# Patient Record
Sex: Female | Born: 1935 | Race: White | Hispanic: No | Smoking: Former smoker
Health system: Southern US, Community
[De-identification: ages and names within clinical notes are randomized; demographics above are authoritative.]

## PROBLEM LIST (undated history)

## (undated) DIAGNOSIS — I10 Essential (primary) hypertension: Secondary | ICD-10-CM

## (undated) DIAGNOSIS — C50919 Malignant neoplasm of unspecified site of unspecified female breast: Secondary | ICD-10-CM

## (undated) DIAGNOSIS — Z923 Personal history of irradiation: Secondary | ICD-10-CM

## (undated) DIAGNOSIS — E079 Disorder of thyroid, unspecified: Secondary | ICD-10-CM

## (undated) DIAGNOSIS — M8580 Other specified disorders of bone density and structure, unspecified site: Secondary | ICD-10-CM

## (undated) DIAGNOSIS — E785 Hyperlipidemia, unspecified: Secondary | ICD-10-CM

## (undated) HISTORY — DX: Other specified disorders of bone density and structure, unspecified site: M85.80

## (undated) HISTORY — PX: VAGINAL HYSTERECTOMY: SUR661

## (undated) HISTORY — DX: Disorder of thyroid, unspecified: E07.9

## (undated) HISTORY — DX: Essential (primary) hypertension: I10

## (undated) HISTORY — DX: Hyperlipidemia, unspecified: E78.5

---

## 1987-02-06 DIAGNOSIS — Z923 Personal history of irradiation: Secondary | ICD-10-CM

## 1987-02-06 DIAGNOSIS — C50919 Malignant neoplasm of unspecified site of unspecified female breast: Secondary | ICD-10-CM

## 1987-02-06 HISTORY — DX: Malignant neoplasm of unspecified site of unspecified female breast: C50.919

## 1987-02-06 HISTORY — PX: BREAST LUMPECTOMY: SHX2

## 1987-02-06 HISTORY — DX: Personal history of irradiation: Z92.3

## 1997-07-01 ENCOUNTER — Ambulatory Visit (HOSPITAL_COMMUNITY): Admission: RE | Admit: 1997-07-01 | Discharge: 1997-07-02 | Payer: Self-pay | Admitting: General Surgery

## 1998-04-06 ENCOUNTER — Ambulatory Visit (HOSPITAL_COMMUNITY): Admission: RE | Admit: 1998-04-06 | Discharge: 1998-04-06 | Payer: Self-pay | Admitting: Specialist

## 1998-05-09 ENCOUNTER — Ambulatory Visit (HOSPITAL_COMMUNITY): Admission: RE | Admit: 1998-05-09 | Discharge: 1998-05-09 | Payer: Self-pay | Admitting: Specialist

## 1998-06-07 ENCOUNTER — Ambulatory Visit: Admission: RE | Admit: 1998-06-07 | Discharge: 1998-06-07 | Payer: Self-pay | Admitting: Family Medicine

## 1999-07-04 ENCOUNTER — Encounter: Admission: RE | Admit: 1999-07-04 | Discharge: 1999-07-04 | Payer: Self-pay | Admitting: Family Medicine

## 1999-07-04 ENCOUNTER — Encounter: Payer: Self-pay | Admitting: Family Medicine

## 2000-07-09 ENCOUNTER — Encounter: Payer: Self-pay | Admitting: Family Medicine

## 2000-07-09 ENCOUNTER — Encounter: Admission: RE | Admit: 2000-07-09 | Discharge: 2000-07-09 | Payer: Self-pay | Admitting: Family Medicine

## 2000-10-23 ENCOUNTER — Ambulatory Visit (HOSPITAL_COMMUNITY): Admission: RE | Admit: 2000-10-23 | Discharge: 2000-10-23 | Payer: Self-pay | Admitting: Gastroenterology

## 2001-07-08 ENCOUNTER — Other Ambulatory Visit: Admission: RE | Admit: 2001-07-08 | Discharge: 2001-07-08 | Payer: Self-pay | Admitting: Family Medicine

## 2001-07-15 ENCOUNTER — Encounter: Admission: RE | Admit: 2001-07-15 | Discharge: 2001-07-15 | Payer: Self-pay | Admitting: Family Medicine

## 2001-07-15 ENCOUNTER — Encounter: Payer: Self-pay | Admitting: Family Medicine

## 2002-08-04 ENCOUNTER — Encounter: Admission: RE | Admit: 2002-08-04 | Discharge: 2002-08-04 | Payer: Self-pay | Admitting: Family Medicine

## 2002-08-04 ENCOUNTER — Encounter: Payer: Self-pay | Admitting: Family Medicine

## 2003-08-16 ENCOUNTER — Encounter: Admission: RE | Admit: 2003-08-16 | Discharge: 2003-08-16 | Payer: Self-pay | Admitting: Family Medicine

## 2004-08-03 ENCOUNTER — Other Ambulatory Visit: Admission: RE | Admit: 2004-08-03 | Discharge: 2004-08-03 | Payer: Self-pay | Admitting: Family Medicine

## 2004-08-21 ENCOUNTER — Encounter: Admission: RE | Admit: 2004-08-21 | Discharge: 2004-08-21 | Payer: Self-pay | Admitting: Family Medicine

## 2005-10-04 ENCOUNTER — Encounter: Admission: RE | Admit: 2005-10-04 | Discharge: 2005-10-04 | Payer: Self-pay | Admitting: Family Medicine

## 2006-08-12 ENCOUNTER — Inpatient Hospital Stay (HOSPITAL_COMMUNITY): Admission: AD | Admit: 2006-08-12 | Discharge: 2006-08-15 | Payer: Self-pay | Admitting: Orthopedic Surgery

## 2006-10-09 ENCOUNTER — Encounter: Admission: RE | Admit: 2006-10-09 | Discharge: 2006-10-09 | Payer: Self-pay | Admitting: Family Medicine

## 2007-10-20 ENCOUNTER — Encounter: Admission: RE | Admit: 2007-10-20 | Discharge: 2007-10-20 | Payer: Self-pay | Admitting: Family Medicine

## 2008-10-20 ENCOUNTER — Encounter: Admission: RE | Admit: 2008-10-20 | Discharge: 2008-10-20 | Payer: Self-pay | Admitting: Family Medicine

## 2009-07-11 ENCOUNTER — Emergency Department (HOSPITAL_COMMUNITY): Admission: EM | Admit: 2009-07-11 | Discharge: 2009-07-11 | Payer: Self-pay | Admitting: Family Medicine

## 2009-10-25 ENCOUNTER — Encounter: Admission: RE | Admit: 2009-10-25 | Discharge: 2009-10-25 | Payer: Self-pay | Admitting: Family Medicine

## 2010-04-24 LAB — WOUND CULTURE

## 2010-06-23 NOTE — Procedures (Signed)
[facility]  Patient:    Frances Short, Frances Short Visit Number: [id number] MRN: [id number]          Service Type: END Location: ENDO Attending Physician:  Orland Mustard Dictated by:   Llana Aliment. Randa Evens, M.D. Proc. Date: 10/23/00 Admit Date:  10/23/2000 Discharge Date: 10/23/2000   CC:         Molly Maduro K. Abigail Miyamoto, M.D.   Procedure Report  PROCEDURE:  Colonoscopy.  SURGEON:  James L. Randa Evens, M.D.  MEDICATIONS:  Fentanyl 125 mcg and Versed 10 mg IV.  SCOPE:  Pediatric Olympus video colonoscope.  INDICATIONS:  Colon cancer screening.  DESCRIPTION OF PROCEDURE:  The procedure had been explained to the patient and consent obtained.  With the patient in the left lateral decubitus position, the Olympus pediatric video colonoscope was inserted and advanced under direct visualization.  The prep was quite good.  Using abdominal pressure and position changes, we were able to advance to the cecum without difficulty. The ileocecal valve and appendiceal orifice were seen.  The scope was withdrawn.  The cecum, ascending colon, hepatic flexure, transverse colon, splenic flexure, descending, and sigmoid colon were seen well upon removal. No polyps or other lesions were seen.  The scope was withdrawn.  The patient tolerated the procedure well.  ASSESSMENT:  Normal screening colonoscopy.  PLAN:  Routine follow up with Dr. Abigail Miyamoto with yearly Hemoccults.  Consider sigmoidoscopy in five years and repeat colonoscopy in 10 years if clinically appropriate. Dictated by:   Llana Aliment. Randa Evens, M.D. Attending Physician:  Orland Mustard DD:  10/23/00 TD:  10/25/00 Job: [id number]

## 2010-06-23 NOTE — Discharge Summary (Signed)
Frances Short, Frances Short                 ACCOUNT NO.:  [id number]   MEDICAL RECORD NO.:  [id number]          PATIENT TYPE:  INP   LOCATION:  5004                         FACILITY:  [facility]   PHYSICIAN:  Frances Ano. Gramig, M.D.DATE OF BIRTH:  December 04, 1926   DATE OF ADMISSION:  08/12/2006  DATE OF DISCHARGE:  08/15/2006                               DISCHARGE SUMMARY   ADMITTING DIAGNOSES:  1. Right hand abscess with ascending cellulitis.  2. History of hypothyroidism.  3. History of osteoporosis.  4. Hypercholesterolemia.   SURGEON:  Dr. Dominica Severin.   CONSULTATIONS:  None.   BRIEF HISTORY AND PHYSICAL:  Frances Short is a very pleasant 77 year old  female who was see and evaluated in the clinic setting on August 12, 2006  for evaluation of her right hand.  She was noted to have had  difficulties with the hand in terms of swelling, redness, and pain over  the past few days.  Initially seen by Dr. Clarene Duke who performed an I and  D, and upon return evaluation she had no improvement with continued  signs of worsening erythema and ascending cellulitis.  She was therefore  referred to the office for further follow up care.  She was seen and  evaluated at [facility] and was noted to have an  abscess about the right hand with signs of ascending cellulitis.  She  underwent an in-office I and D at that time, and plans were made for  admittance to the hospital for IV antibiotics as well as local wound  care.   HOSPITAL COURSE:  On August 12, 2006, Frances Short presented to the [facility] where she was admitted for a right hand abscess with  ascending cellulitis.  Wound consultation was placed via therapy, daily  lavages, and dressing changes/wicking.  She was also started on  vancomycin and prophylactically __________  dosing.  She did very well  overall throughout her stay over the next 3 days.  She did have an  episode of nausea which was controlled per Phenergan.  She was  given  Ambien to assist her with sleeping difficulty.  She continued to with  wound care each day, wicking and dressing changes per therapy without  difficult.  She remained afebrile.  Her wounds continued to improve as  well as her ascending cellulitis.  Inner office cultures were obtained  and results were pending.  Again, continued improvement in terms of her  wound.  On August 14, 2006, decision was made to discharge her home the  following morning after her pulse lavage was performed.  She remained  afebrile and stable throughout.  Her erythema had improved a great deal.  On August 15, 2006, decision was made to discharge her home.   ASSESSMENT/FINAL DIAGNOSES:  1. Status post right hand abscess with ascending cellulitis, status      post incision and drainage performed in the office setting.  2. History of hypothyroidism.  3. History of osteoporosis.  4. Hypercholesterolemia.   Her condition on discharge was stable.  Diet is regular.  Activities:  She  will keep her dressings clean and dry.  Move her fingers frequently.   DISCHARGE MEDICATIONS:  1. Doxycycline 100 mg one p.o. b.i.d.  2. Darvocet-N 100 one to two every 4 to 6 p.r.n.  3. Ambien 10 mg p.o. q.h.s. p.r.n.  4. She will resume her home medications regimen of Synthroid 100 mcg a      day, __________  mg daily, simvastatin daily, alendronate 10 mg      once a week, aspirin 81 mg, __________   She will follow up with Dr. Amanda Short at the end of the week on Friday at 4  p.m.  Her wound care will be continued.      Karie Chimera, P.A.-C.    ______________________________  Frances Short, M.D.    BB/MEDQ  D:  09/16/2006  T:  09/16/2006  Job:  [id number]

## 2010-09-19 ENCOUNTER — Other Ambulatory Visit: Payer: Self-pay | Admitting: Family Medicine

## 2010-09-19 DIAGNOSIS — Z1231 Encounter for screening mammogram for malignant neoplasm of breast: Secondary | ICD-10-CM

## 2010-10-31 ENCOUNTER — Ambulatory Visit
Admission: RE | Admit: 2010-10-31 | Discharge: 2010-10-31 | Disposition: A | Discharge status: Home / Self Care | Payer: Medicare Other | Source: Ambulatory Visit | Attending: Family Medicine | Admitting: Family Medicine

## 2010-10-31 DIAGNOSIS — Z1231 Encounter for screening mammogram for malignant neoplasm of breast: Secondary | ICD-10-CM

## 2010-11-21 LAB — CREATININE, SERUM
Creatinine, Ser: 0.53
GFR calc Af Amer: >60
GFR calc non Af Amer: >60

## 2011-10-02 ENCOUNTER — Other Ambulatory Visit: Payer: Self-pay | Admitting: Family Medicine

## 2011-10-02 DIAGNOSIS — Z1231 Encounter for screening mammogram for malignant neoplasm of breast: Secondary | ICD-10-CM

## 2011-10-26 ENCOUNTER — Other Ambulatory Visit: Payer: Self-pay | Admitting: Family Medicine

## 2011-10-26 DIAGNOSIS — M899 Disorder of bone, unspecified: Secondary | ICD-10-CM

## 2011-11-01 ENCOUNTER — Ambulatory Visit
Admission: RE | Admit: 2011-11-01 | Discharge: 2011-11-01 | Disposition: A | Discharge status: Home / Self Care | Payer: Medicare Other | Source: Ambulatory Visit | Attending: Family Medicine | Admitting: Family Medicine

## 2011-11-01 DIAGNOSIS — Z1231 Encounter for screening mammogram for malignant neoplasm of breast: Secondary | ICD-10-CM

## 2011-11-01 DIAGNOSIS — M899 Disorder of bone, unspecified: Secondary | ICD-10-CM

## 2011-11-01 DIAGNOSIS — M949 Disorder of cartilage, unspecified: Secondary | ICD-10-CM

## 2012-09-23 ENCOUNTER — Other Ambulatory Visit: Payer: Self-pay

## 2012-09-23 DIAGNOSIS — Z1231 Encounter for screening mammogram for malignant neoplasm of breast: Secondary | ICD-10-CM

## 2012-11-03 ENCOUNTER — Ambulatory Visit
Admission: RE | Admit: 2012-11-03 | Discharge: 2012-11-03 | Disposition: A | Discharge status: Home / Self Care | Payer: Medicare Other | Source: Ambulatory Visit

## 2012-11-03 DIAGNOSIS — Z1231 Encounter for screening mammogram for malignant neoplasm of breast: Secondary | ICD-10-CM

## 2013-09-20 ENCOUNTER — Encounter: Payer: Self-pay | Admitting: *Deleted

## 2013-09-20 DIAGNOSIS — I10 Essential (primary) hypertension: Secondary | ICD-10-CM

## 2013-09-20 DIAGNOSIS — E785 Hyperlipidemia, unspecified: Secondary | ICD-10-CM | POA: Insufficient documentation

## 2013-10-05 ENCOUNTER — Other Ambulatory Visit: Payer: Self-pay

## 2013-10-05 DIAGNOSIS — Z1231 Encounter for screening mammogram for malignant neoplasm of breast: Secondary | ICD-10-CM

## 2013-11-05 ENCOUNTER — Ambulatory Visit
Admission: RE | Admit: 2013-11-05 | Discharge: 2013-11-05 | Disposition: A | Discharge status: Home / Self Care | Payer: Medicare Other | Source: Ambulatory Visit

## 2013-11-05 DIAGNOSIS — Z1231 Encounter for screening mammogram for malignant neoplasm of breast: Secondary | ICD-10-CM

## 2014-09-22 ENCOUNTER — Other Ambulatory Visit: Payer: Self-pay

## 2014-09-22 DIAGNOSIS — Z1231 Encounter for screening mammogram for malignant neoplasm of breast: Secondary | ICD-10-CM

## 2014-09-23 ENCOUNTER — Other Ambulatory Visit: Payer: Self-pay | Admitting: Family Medicine

## 2014-09-23 DIAGNOSIS — T380X5A Adverse effect of glucocorticoids and synthetic analogues, initial encounter: Principal | ICD-10-CM

## 2014-09-23 DIAGNOSIS — M8580 Other specified disorders of bone density and structure, unspecified site: Secondary | ICD-10-CM

## 2014-11-08 ENCOUNTER — Ambulatory Visit
Admission: RE | Admit: 2014-11-08 | Discharge: 2014-11-08 | Disposition: A | Discharge status: Home / Self Care | Payer: Medicare Other | Source: Ambulatory Visit

## 2014-11-08 ENCOUNTER — Ambulatory Visit
Admission: RE | Admit: 2014-11-08 | Discharge: 2014-11-08 | Disposition: A | Discharge status: Home / Self Care | Payer: Medicare Other | Source: Ambulatory Visit | Attending: Family Medicine | Admitting: Family Medicine

## 2014-11-08 DIAGNOSIS — M8580 Other specified disorders of bone density and structure, unspecified site: Secondary | ICD-10-CM

## 2014-11-08 DIAGNOSIS — Z1231 Encounter for screening mammogram for malignant neoplasm of breast: Secondary | ICD-10-CM

## 2014-11-08 DIAGNOSIS — T380X5A Adverse effect of glucocorticoids and synthetic analogues, initial encounter: Principal | ICD-10-CM

## 2015-03-28 DIAGNOSIS — T148 Other injury of unspecified body region: Secondary | ICD-10-CM | POA: Diagnosis not present

## 2015-05-06 DIAGNOSIS — H3530 Unspecified macular degeneration: Secondary | ICD-10-CM | POA: Diagnosis not present

## 2015-05-06 DIAGNOSIS — Z23 Encounter for immunization: Secondary | ICD-10-CM | POA: Diagnosis not present

## 2015-05-06 DIAGNOSIS — Z0000 Encounter for general adult medical examination without abnormal findings: Secondary | ICD-10-CM | POA: Diagnosis not present

## 2015-05-06 DIAGNOSIS — E782 Mixed hyperlipidemia: Secondary | ICD-10-CM | POA: Diagnosis not present

## 2015-05-06 DIAGNOSIS — E559 Vitamin D deficiency, unspecified: Secondary | ICD-10-CM | POA: Diagnosis not present

## 2015-05-06 DIAGNOSIS — I10 Essential (primary) hypertension: Secondary | ICD-10-CM | POA: Diagnosis not present

## 2015-05-06 DIAGNOSIS — M85852 Other specified disorders of bone density and structure, left thigh: Secondary | ICD-10-CM | POA: Diagnosis not present

## 2015-05-06 DIAGNOSIS — L819 Disorder of pigmentation, unspecified: Secondary | ICD-10-CM | POA: Diagnosis not present

## 2015-05-06 DIAGNOSIS — E039 Hypothyroidism, unspecified: Secondary | ICD-10-CM | POA: Diagnosis not present

## 2015-05-06 DIAGNOSIS — N952 Postmenopausal atrophic vaginitis: Secondary | ICD-10-CM | POA: Diagnosis not present

## 2015-05-11 DIAGNOSIS — M85852 Other specified disorders of bone density and structure, left thigh: Secondary | ICD-10-CM | POA: Diagnosis not present

## 2015-05-11 DIAGNOSIS — H3530 Unspecified macular degeneration: Secondary | ICD-10-CM | POA: Diagnosis not present

## 2015-05-11 DIAGNOSIS — E782 Mixed hyperlipidemia: Secondary | ICD-10-CM | POA: Diagnosis not present

## 2015-05-11 DIAGNOSIS — Z1389 Encounter for screening for other disorder: Secondary | ICD-10-CM | POA: Diagnosis not present

## 2015-05-11 DIAGNOSIS — I10 Essential (primary) hypertension: Secondary | ICD-10-CM | POA: Diagnosis not present

## 2015-05-11 DIAGNOSIS — N952 Postmenopausal atrophic vaginitis: Secondary | ICD-10-CM | POA: Diagnosis not present

## 2015-05-11 DIAGNOSIS — E039 Hypothyroidism, unspecified: Secondary | ICD-10-CM | POA: Diagnosis not present

## 2015-07-11 DIAGNOSIS — K130 Diseases of lips: Secondary | ICD-10-CM | POA: Diagnosis not present

## 2015-08-05 DIAGNOSIS — N758 Other diseases of Bartholin's gland: Secondary | ICD-10-CM | POA: Diagnosis not present

## 2015-10-13 ENCOUNTER — Other Ambulatory Visit: Payer: Self-pay | Admitting: Family Medicine

## 2015-10-13 DIAGNOSIS — Z1231 Encounter for screening mammogram for malignant neoplasm of breast: Secondary | ICD-10-CM

## 2015-11-11 ENCOUNTER — Ambulatory Visit
Admission: RE | Admit: 2015-11-11 | Discharge: 2015-11-11 | Disposition: A | Discharge status: Home / Self Care | Payer: Medicare Other | Source: Ambulatory Visit | Attending: Family Medicine | Admitting: Family Medicine

## 2015-11-11 DIAGNOSIS — Z1231 Encounter for screening mammogram for malignant neoplasm of breast: Secondary | ICD-10-CM | POA: Diagnosis not present

## 2015-11-14 DIAGNOSIS — H3530 Unspecified macular degeneration: Secondary | ICD-10-CM | POA: Diagnosis not present

## 2015-11-14 DIAGNOSIS — Z853 Personal history of malignant neoplasm of breast: Secondary | ICD-10-CM | POA: Diagnosis not present

## 2015-11-14 DIAGNOSIS — N952 Postmenopausal atrophic vaginitis: Secondary | ICD-10-CM | POA: Diagnosis not present

## 2015-11-14 DIAGNOSIS — E559 Vitamin D deficiency, unspecified: Secondary | ICD-10-CM | POA: Diagnosis not present

## 2015-11-14 DIAGNOSIS — Z0000 Encounter for general adult medical examination without abnormal findings: Secondary | ICD-10-CM | POA: Diagnosis not present

## 2015-11-14 DIAGNOSIS — E039 Hypothyroidism, unspecified: Secondary | ICD-10-CM | POA: Diagnosis not present

## 2015-11-14 DIAGNOSIS — I10 Essential (primary) hypertension: Secondary | ICD-10-CM | POA: Diagnosis not present

## 2015-11-14 DIAGNOSIS — M85852 Other specified disorders of bone density and structure, left thigh: Secondary | ICD-10-CM | POA: Diagnosis not present

## 2015-11-14 DIAGNOSIS — E782 Mixed hyperlipidemia: Secondary | ICD-10-CM | POA: Diagnosis not present

## 2015-11-14 DIAGNOSIS — Z23 Encounter for immunization: Secondary | ICD-10-CM | POA: Diagnosis not present

## 2016-05-10 DIAGNOSIS — E782 Mixed hyperlipidemia: Secondary | ICD-10-CM | POA: Diagnosis not present

## 2016-05-10 DIAGNOSIS — I10 Essential (primary) hypertension: Secondary | ICD-10-CM | POA: Diagnosis not present

## 2016-05-10 DIAGNOSIS — E039 Hypothyroidism, unspecified: Secondary | ICD-10-CM | POA: Diagnosis not present

## 2016-05-10 DIAGNOSIS — Z0000 Encounter for general adult medical examination without abnormal findings: Secondary | ICD-10-CM | POA: Diagnosis not present

## 2016-05-14 DIAGNOSIS — I10 Essential (primary) hypertension: Secondary | ICD-10-CM | POA: Diagnosis not present

## 2016-05-14 DIAGNOSIS — N952 Postmenopausal atrophic vaginitis: Secondary | ICD-10-CM | POA: Diagnosis not present

## 2016-05-14 DIAGNOSIS — M85852 Other specified disorders of bone density and structure, left thigh: Secondary | ICD-10-CM | POA: Diagnosis not present

## 2016-05-14 DIAGNOSIS — E782 Mixed hyperlipidemia: Secondary | ICD-10-CM | POA: Diagnosis not present

## 2016-09-04 DIAGNOSIS — L723 Sebaceous cyst: Secondary | ICD-10-CM | POA: Diagnosis not present

## 2016-10-04 ENCOUNTER — Other Ambulatory Visit: Payer: Self-pay | Admitting: Family Medicine

## 2016-10-04 DIAGNOSIS — Z1231 Encounter for screening mammogram for malignant neoplasm of breast: Secondary | ICD-10-CM

## 2016-10-11 DIAGNOSIS — Z23 Encounter for immunization: Secondary | ICD-10-CM | POA: Diagnosis not present

## 2016-10-24 DIAGNOSIS — L03032 Cellulitis of left toe: Secondary | ICD-10-CM | POA: Diagnosis not present

## 2016-11-12 ENCOUNTER — Ambulatory Visit
Admission: RE | Admit: 2016-11-12 | Discharge: 2016-11-12 | Disposition: A | Discharge status: Home / Self Care | Payer: Medicare Other | Source: Ambulatory Visit | Attending: Family Medicine | Admitting: Family Medicine

## 2016-11-12 DIAGNOSIS — Z1231 Encounter for screening mammogram for malignant neoplasm of breast: Secondary | ICD-10-CM

## 2016-11-12 HISTORY — DX: Personal history of irradiation: Z92.3

## 2016-11-12 HISTORY — DX: Malignant neoplasm of unspecified site of unspecified female breast: C50.919

## 2016-11-16 DIAGNOSIS — E782 Mixed hyperlipidemia: Secondary | ICD-10-CM | POA: Diagnosis not present

## 2016-11-16 DIAGNOSIS — I10 Essential (primary) hypertension: Secondary | ICD-10-CM | POA: Diagnosis not present

## 2016-11-16 DIAGNOSIS — Z0000 Encounter for general adult medical examination without abnormal findings: Secondary | ICD-10-CM | POA: Diagnosis not present

## 2016-11-16 DIAGNOSIS — N952 Postmenopausal atrophic vaginitis: Secondary | ICD-10-CM | POA: Diagnosis not present

## 2016-11-20 DIAGNOSIS — I10 Essential (primary) hypertension: Secondary | ICD-10-CM | POA: Diagnosis not present

## 2016-11-20 DIAGNOSIS — I517 Cardiomegaly: Secondary | ICD-10-CM | POA: Diagnosis not present

## 2016-11-20 DIAGNOSIS — E782 Mixed hyperlipidemia: Secondary | ICD-10-CM | POA: Diagnosis not present

## 2016-11-20 DIAGNOSIS — E039 Hypothyroidism, unspecified: Secondary | ICD-10-CM | POA: Diagnosis not present

## 2016-11-21 ENCOUNTER — Other Ambulatory Visit: Payer: Self-pay | Admitting: Family Medicine

## 2016-11-21 DIAGNOSIS — M85852 Other specified disorders of bone density and structure, left thigh: Secondary | ICD-10-CM

## 2016-12-06 ENCOUNTER — Ambulatory Visit
Admission: RE | Admit: 2016-12-06 | Discharge: 2016-12-06 | Disposition: A | Discharge status: Home / Self Care | Payer: Medicare Other | Source: Ambulatory Visit | Attending: Family Medicine | Admitting: Family Medicine

## 2016-12-06 DIAGNOSIS — M810 Age-related osteoporosis without current pathological fracture: Secondary | ICD-10-CM | POA: Diagnosis not present

## 2016-12-06 DIAGNOSIS — M85852 Other specified disorders of bone density and structure, left thigh: Secondary | ICD-10-CM

## 2017-01-22 DIAGNOSIS — F419 Anxiety disorder, unspecified: Secondary | ICD-10-CM | POA: Diagnosis not present

## 2017-01-22 DIAGNOSIS — M810 Age-related osteoporosis without current pathological fracture: Secondary | ICD-10-CM | POA: Diagnosis not present

## 2017-01-22 DIAGNOSIS — H6121 Impacted cerumen, right ear: Secondary | ICD-10-CM | POA: Diagnosis not present

## 2017-03-04 DIAGNOSIS — F419 Anxiety disorder, unspecified: Secondary | ICD-10-CM | POA: Diagnosis not present

## 2017-03-04 DIAGNOSIS — M818 Other osteoporosis without current pathological fracture: Secondary | ICD-10-CM | POA: Diagnosis not present

## 2017-03-11 DIAGNOSIS — N750 Cyst of Bartholin's gland: Secondary | ICD-10-CM | POA: Diagnosis not present

## 2017-03-11 DIAGNOSIS — M810 Age-related osteoporosis without current pathological fracture: Secondary | ICD-10-CM | POA: Diagnosis not present

## 2017-03-19 DIAGNOSIS — M810 Age-related osteoporosis without current pathological fracture: Secondary | ICD-10-CM | POA: Diagnosis not present

## 2017-04-15 DIAGNOSIS — F419 Anxiety disorder, unspecified: Secondary | ICD-10-CM | POA: Diagnosis not present

## 2017-04-15 DIAGNOSIS — M818 Other osteoporosis without current pathological fracture: Secondary | ICD-10-CM | POA: Diagnosis not present

## 2017-04-15 DIAGNOSIS — E039 Hypothyroidism, unspecified: Secondary | ICD-10-CM | POA: Diagnosis not present

## 2017-05-20 DIAGNOSIS — F419 Anxiety disorder, unspecified: Secondary | ICD-10-CM | POA: Diagnosis not present

## 2017-05-20 DIAGNOSIS — E039 Hypothyroidism, unspecified: Secondary | ICD-10-CM | POA: Diagnosis not present

## 2017-05-20 DIAGNOSIS — M818 Other osteoporosis without current pathological fracture: Secondary | ICD-10-CM | POA: Diagnosis not present

## 2017-05-23 DIAGNOSIS — E782 Mixed hyperlipidemia: Secondary | ICD-10-CM | POA: Diagnosis not present

## 2017-05-23 DIAGNOSIS — I517 Cardiomegaly: Secondary | ICD-10-CM | POA: Diagnosis not present

## 2017-05-23 DIAGNOSIS — I10 Essential (primary) hypertension: Secondary | ICD-10-CM | POA: Diagnosis not present

## 2017-05-23 DIAGNOSIS — Z0000 Encounter for general adult medical examination without abnormal findings: Secondary | ICD-10-CM | POA: Diagnosis not present

## 2017-09-17 DIAGNOSIS — M818 Other osteoporosis without current pathological fracture: Secondary | ICD-10-CM | POA: Diagnosis not present

## 2017-10-11 ENCOUNTER — Other Ambulatory Visit: Payer: Self-pay | Admitting: Family Medicine

## 2017-10-11 DIAGNOSIS — Z1231 Encounter for screening mammogram for malignant neoplasm of breast: Secondary | ICD-10-CM

## 2017-10-30 DIAGNOSIS — Z23 Encounter for immunization: Secondary | ICD-10-CM | POA: Diagnosis not present

## 2017-11-14 ENCOUNTER — Ambulatory Visit
Admission: RE | Admit: 2017-11-14 | Discharge: 2017-11-14 | Disposition: A | Discharge status: Home / Self Care | Payer: Medicare Other | Source: Ambulatory Visit | Attending: Family Medicine | Admitting: Family Medicine

## 2017-11-14 DIAGNOSIS — Z1231 Encounter for screening mammogram for malignant neoplasm of breast: Secondary | ICD-10-CM

## 2017-11-19 DIAGNOSIS — I10 Essential (primary) hypertension: Secondary | ICD-10-CM | POA: Diagnosis not present

## 2017-11-19 DIAGNOSIS — E782 Mixed hyperlipidemia: Secondary | ICD-10-CM | POA: Diagnosis not present

## 2017-11-19 DIAGNOSIS — I517 Cardiomegaly: Secondary | ICD-10-CM | POA: Diagnosis not present

## 2017-11-19 DIAGNOSIS — Z0000 Encounter for general adult medical examination without abnormal findings: Secondary | ICD-10-CM | POA: Diagnosis not present

## 2017-11-22 DIAGNOSIS — I10 Essential (primary) hypertension: Secondary | ICD-10-CM | POA: Diagnosis not present

## 2017-11-22 DIAGNOSIS — E782 Mixed hyperlipidemia: Secondary | ICD-10-CM | POA: Diagnosis not present

## 2017-11-22 DIAGNOSIS — I517 Cardiomegaly: Secondary | ICD-10-CM | POA: Diagnosis not present

## 2017-11-22 DIAGNOSIS — E039 Hypothyroidism, unspecified: Secondary | ICD-10-CM | POA: Diagnosis not present

## 2018-03-21 DIAGNOSIS — M818 Other osteoporosis without current pathological fracture: Secondary | ICD-10-CM | POA: Diagnosis not present

## 2018-06-16 DIAGNOSIS — I10 Essential (primary) hypertension: Secondary | ICD-10-CM | POA: Diagnosis not present

## 2018-06-16 DIAGNOSIS — Z0000 Encounter for general adult medical examination without abnormal findings: Secondary | ICD-10-CM | POA: Diagnosis not present

## 2018-06-16 DIAGNOSIS — E782 Mixed hyperlipidemia: Secondary | ICD-10-CM | POA: Diagnosis not present

## 2018-06-16 DIAGNOSIS — E039 Hypothyroidism, unspecified: Secondary | ICD-10-CM | POA: Diagnosis not present

## 2018-09-22 DIAGNOSIS — M818 Other osteoporosis without current pathological fracture: Secondary | ICD-10-CM | POA: Diagnosis not present

## 2018-11-17 DIAGNOSIS — E559 Vitamin D deficiency, unspecified: Secondary | ICD-10-CM | POA: Diagnosis not present

## 2018-11-17 DIAGNOSIS — I10 Essential (primary) hypertension: Secondary | ICD-10-CM | POA: Diagnosis not present

## 2018-11-17 DIAGNOSIS — E782 Mixed hyperlipidemia: Secondary | ICD-10-CM | POA: Diagnosis not present

## 2018-11-17 DIAGNOSIS — E039 Hypothyroidism, unspecified: Secondary | ICD-10-CM | POA: Diagnosis not present

## 2019-06-16 ENCOUNTER — Ambulatory Visit
Admission: RE | Admit: 2019-06-16 | Discharge: 2019-06-16 | Disposition: A | Discharge status: Home / Self Care | Payer: Medicare Other | Source: Ambulatory Visit | Attending: Family Medicine | Admitting: Family Medicine

## 2019-06-16 ENCOUNTER — Other Ambulatory Visit: Payer: Self-pay | Admitting: Family Medicine

## 2019-06-16 DIAGNOSIS — T1490XA Injury, unspecified, initial encounter: Secondary | ICD-10-CM

## 2019-06-16 DIAGNOSIS — M810 Age-related osteoporosis without current pathological fracture: Secondary | ICD-10-CM | POA: Diagnosis not present

## 2019-06-16 DIAGNOSIS — M7989 Other specified soft tissue disorders: Secondary | ICD-10-CM | POA: Diagnosis not present

## 2019-06-16 DIAGNOSIS — M545 Low back pain: Secondary | ICD-10-CM | POA: Diagnosis not present

## 2019-06-16 DIAGNOSIS — I340 Nonrheumatic mitral (valve) insufficiency: Secondary | ICD-10-CM | POA: Diagnosis not present

## 2019-06-19 DIAGNOSIS — Z0000 Encounter for general adult medical examination without abnormal findings: Secondary | ICD-10-CM | POA: Diagnosis not present

## 2019-06-19 DIAGNOSIS — R600 Localized edema: Secondary | ICD-10-CM | POA: Diagnosis not present

## 2019-06-19 DIAGNOSIS — I872 Venous insufficiency (chronic) (peripheral): Secondary | ICD-10-CM | POA: Diagnosis not present

## 2019-06-22 DIAGNOSIS — Z79899 Other long term (current) drug therapy: Secondary | ICD-10-CM | POA: Diagnosis not present

## 2019-06-22 DIAGNOSIS — M545 Low back pain: Secondary | ICD-10-CM | POA: Diagnosis not present

## 2019-06-22 DIAGNOSIS — M7989 Other specified soft tissue disorders: Secondary | ICD-10-CM | POA: Diagnosis not present

## 2019-06-23 DIAGNOSIS — M810 Age-related osteoporosis without current pathological fracture: Secondary | ICD-10-CM | POA: Diagnosis not present

## 2019-06-23 DIAGNOSIS — I340 Nonrheumatic mitral (valve) insufficiency: Secondary | ICD-10-CM | POA: Diagnosis not present

## 2019-06-23 DIAGNOSIS — H3530 Unspecified macular degeneration: Secondary | ICD-10-CM | POA: Diagnosis not present

## 2019-06-23 DIAGNOSIS — F419 Anxiety disorder, unspecified: Secondary | ICD-10-CM | POA: Diagnosis not present

## 2019-06-23 DIAGNOSIS — N952 Postmenopausal atrophic vaginitis: Secondary | ICD-10-CM | POA: Diagnosis not present

## 2019-06-23 DIAGNOSIS — Z7982 Long term (current) use of aspirin: Secondary | ICD-10-CM | POA: Diagnosis not present

## 2019-06-23 DIAGNOSIS — I119 Hypertensive heart disease without heart failure: Secondary | ICD-10-CM | POA: Diagnosis not present

## 2019-06-23 DIAGNOSIS — M7989 Other specified soft tissue disorders: Secondary | ICD-10-CM | POA: Diagnosis not present

## 2019-06-23 DIAGNOSIS — Z87891 Personal history of nicotine dependence: Secondary | ICD-10-CM | POA: Diagnosis not present

## 2019-06-23 DIAGNOSIS — E782 Mixed hyperlipidemia: Secondary | ICD-10-CM | POA: Diagnosis not present

## 2019-06-23 DIAGNOSIS — M545 Low back pain: Secondary | ICD-10-CM | POA: Diagnosis not present

## 2019-06-23 DIAGNOSIS — I429 Cardiomyopathy, unspecified: Secondary | ICD-10-CM | POA: Diagnosis not present

## 2019-06-23 DIAGNOSIS — Z853 Personal history of malignant neoplasm of breast: Secondary | ICD-10-CM | POA: Diagnosis not present

## 2019-06-23 DIAGNOSIS — F329 Major depressive disorder, single episode, unspecified: Secondary | ICD-10-CM | POA: Diagnosis not present

## 2019-06-23 DIAGNOSIS — Z79891 Long term (current) use of opiate analgesic: Secondary | ICD-10-CM | POA: Diagnosis not present

## 2019-06-23 DIAGNOSIS — E039 Hypothyroidism, unspecified: Secondary | ICD-10-CM | POA: Diagnosis not present

## 2019-06-25 ENCOUNTER — Other Ambulatory Visit: Payer: Self-pay

## 2019-06-25 ENCOUNTER — Ambulatory Visit: Payer: Medicare Other | Admitting: Cardiology

## 2019-06-25 ENCOUNTER — Encounter: Payer: Self-pay | Admitting: Cardiology

## 2019-06-25 ENCOUNTER — Ambulatory Visit (HOSPITAL_COMMUNITY)
Admission: RE | Admit: 2019-06-25 | Discharge: 2019-06-25 | Disposition: A | Discharge status: Home / Self Care | Payer: Medicare Other | Source: Ambulatory Visit | Attending: Cardiology | Admitting: Cardiology

## 2019-06-25 VITALS — BP 142/70 | HR 77 | Temp 97.2°F | Ht 62.0 in | Wt 126.0 lb

## 2019-06-25 DIAGNOSIS — R600 Localized edema: Secondary | ICD-10-CM

## 2019-06-25 DIAGNOSIS — Z7189 Other specified counseling: Secondary | ICD-10-CM | POA: Diagnosis not present

## 2019-06-25 DIAGNOSIS — R011 Cardiac murmur, unspecified: Secondary | ICD-10-CM

## 2019-06-25 DIAGNOSIS — I10 Essential (primary) hypertension: Secondary | ICD-10-CM | POA: Diagnosis not present

## 2019-06-25 DIAGNOSIS — E785 Hyperlipidemia, unspecified: Secondary | ICD-10-CM

## 2019-06-25 NOTE — Patient Instructions (Signed)
Medication Instructions:  Your Physician recommend you continue on your current medication as directed.    *If you need a refill on your cardiac medications before your next appointment, please call your pharmacy*   Lab Work: None   Testing/Procedures: Your physician has requested that you have a lower extremity venous duplex. This test is an ultrasound of the veins in the legs or arms. It looks at venous blood flow that carries blood from the heart to the legs or arms. Allow one hour for a Lower Venous exam. Allow thirty minutes for an Upper Venous exam. There are no restrictions or special instructions. [place]  Your physician has requested that you have an echocardiogram. Echocardiography is a painless test that uses sound waves to create images of your heart. It provides your doctor with information about the size and shape of your heart and how well your heart's chambers and valves are working. This procedure takes approximately one hour. There are no restrictions for this procedure. [place] [address]     Follow-Up: At [facility], you and your health needs are our priority.  As part of our continuing mission to provide you with exceptional heart care, we have created designated Provider Care Teams.  These Care Teams include your primary Cardiologist (physician) and Advanced Practice Providers (APPs -  Physician Assistants and Nurse Practitioners) who all work together to provide you with the care you need, when you need it.  We recommend signing up for the patient portal called "MyChart".  Sign up information is provided on this After Visit Summary.  MyChart is used to connect with patients for Virtual Visits (Telemedicine).  Patients are able to view lab/test results, encounter notes, upcoming appointments, etc.  Non-urgent messages can be sent to your provider as well.   To learn more about what you can do with MyChart, go to  [email or web address].    Your next appointment:   2-3 week(s)  The format for your next appointment:   In Person  Provider:   Buford Dresser, MD

## 2019-06-25 NOTE — Progress Notes (Signed)
Cardiology Office Note:    Date:  06/25/2019   ID:  Frances Short, DOB 1926-04-13, MRN [id number]  PCP:  Cari Caraway, MD  Cardiologist:  Buford Dresser, MD  Referring MD: Cari Caraway, MD   No chief complaint on file.   History of Present Illness:    Frances Short is a 86 y.o. female with a hx of breast cancer, hypertension who is seen as a new consult at the request of Cari Caraway, MD for the evaluation and management of murmur, LE edema.  I do not have any recent notes from her PCP office. I did receive a copy of her echocardiogram from [facility] in 2012, reviewed below.  Today: Daughter (POA) is present today. Notes a heart murmur for many years, thought to potentially have changed at recent visit.  Leg swelling has been ongoing for 2-3 weeks. Started with right leg, now bilateral. Mild skin discoloration. No drainage. No ultrasounds for DVT done, denies history of thromboembolism. Has been given topical cream and furosemide 20 mg daily, recently increased to 40 mg daily per PCP but not started yet. Never has swelling before. No fevers/chills. Does weight at home occasionally but her scale is old. Thinks her weight is off 3-7 lbs more than her usual.  Has a fall three weeks ago, had back pain. Put on tramadol, which is not helping. Has been getting home health for this. Has been much less active since the fall.  Denies chest pain, shortness of breath at rest or with normal exertion. No PND, orthopnea, or unexpected weight gain. No syncope or palpitations.  Past Medical History:  Diagnosis Date  . Breast cancer ([place]) 1989   left breast  . Hyperlipidemia   . Hypertension   . Osteopenia   . Personal history of radiation therapy 1989  . Thyroid disease     Past Surgical History:  Procedure Laterality Date  . BREAST LUMPECTOMY Left 1989  . VAGINAL HYSTERECTOMY      Current Medications: Current Outpatient Medications on File Prior to Visit   Medication Sig  . levothyroxine (SYNTHROID, LEVOTHROID) 88 MCG tablet Take 88 mcg by mouth daily before breakfast.  . lisinopril (PRINIVIL,ZESTRIL) 10 MG tablet Take 10 mg by mouth daily.  . Multiple Vitamins-Minerals (MULTIVITAL PO) Take by mouth.  . raloxifene (EVISTA) 60 MG tablet Take 60 mg by mouth daily.  . simvastatin (ZOCOR) 80 MG tablet Take 80 mg by mouth daily.  [place] Kitchen VITAMIN D, CHOLECALCIFEROL, PO Take by mouth.   No current facility-administered medications on file prior to visit.     Allergies:   Erythromycin, Macrodantin [nitrofurantoin macrocrystal], Penicillins, and Septra [sulfamethoxazole-trimethoprim]   Social History   Tobacco Use  . Smoking status: Former Smoker  Substance Use Topics  . Alcohol use: No  . Drug use: No    Family History: family history includes Alcoholism in her sister; Coronary artery disease in her father; Heart disease in her sister; Heart failure in her mother.  ROS:   Please see the history of present illness.  Additional pertinent ROS: Constitutional: Negative for chills, fever, night sweats, unintentional weight loss  HENT: Negative for ear pain and hearing loss.   Eyes: Negative for loss of vision and eye pain.  Respiratory: Negative for cough, sputum, wheezing.   Cardiovascular: See HPI. Gastrointestinal: Negative for abdominal pain, melena, and hematochezia.  Genitourinary: Negative for dysuria and hematuria.  Musculoskeletal: Negative for falls and myalgias.  Skin: Negative for itching and rash.  Neurological: Negative  for focal weakness, focal sensory changes and loss of consciousness.  Endo/Heme/Allergies: Does not bruise/bleed easily.     EKGs/Labs/Other Studies Reviewed:    The following studies were reviewed today: Echo report: [place], asymmetric EF 70-75% Trivial TR Moderate MAC Mild-moderate MR Mild AR Trace PR  EKG:  EKG is personally reviewed.  The ekg ordered today demonstrates NSR at 77 bpm, LAD  Recent Labs:  No results found for requested labs within last 8760 hours.  Recent Lipid Panel No results found for: CHOL, TRIG, HDL, CHOLHDL, VLDL, LDLCALC, LDLDIRECT  Physical Exam:    VS:  BP (!) 142/70   Pulse 77   Temp (!) 97.2 F (36.2 C)   Ht _0  (1.575 m)   Wt 126 lb (57.2 kg)   SpO2 99%   BMI 23.05 kg/m     Wt Readings from Last 3 Encounters:  07/14/19 125 lb (56.7 kg)  06/25/19 126 lb (57.2 kg)    GEN: Well nourished, well developed in no acute distress HEENT: Normal, moist mucous membranes NECK: No JVD CARDIAC: regular rhythm, normal S1 and S2, no rubs or gallops. 3/6 systolic ejection murmur radiating to carotids VASCULAR: Radial pulses 2+ bilaterally. Cannot palpate distal pulses due to edema RESPIRATORY:  Clear to auscultation without rales, wheezing or rhonchi  ABDOMEN: Soft, non-tender, non-distended MUSCULOSKELETAL:  Ambulates independently SKIN: Bilateral 3+ dependent edema to thighs, with large area of bullae and redness without erythema NEUROLOGIC:  Alert and oriented x 3. No focal neuro deficits noted. PSYCHIATRIC:  Normal affect    Images of legs taken, unable to upload  ASSESSMENT:    1. Bilateral leg edema   2. Murmur, cardiac   3. Essential hypertension   4. Cardiac risk counseling   5. Hyperlipidemia, unspecified hyperlipidemia type    PLAN:    Severe bilateral LE edema: -very concerning. Multiple possible etiologies of this, need to exclude high risk findings first. Will get ultrasounds today in office to exclude DVT. ADDENDED: no DVT seen -will order echocardiogram to evaluate valves, function, filling pressures -given profuse edema, clear lungs, no JVD, concern for noncardiac cause. Will rule out cardiac etiology first. -they do not want to be admitted if possible and would prefer to manage as outpatient -leg elevation, continue furosemide  Cardiac murmur: -echo as above, rule out severe aortic stenosis  Hypertension: -elevated today, though not  severely.  -continue lasix, lisinopril  Hyperlipidemia: unclear baseline, but [facility] shows labs from 11/2018 are well controlled -continue simvastatin for now  Cardiac risk counseling and prevention recommendations: -recommend heart healthy/Mediterranean diet, with whole grains, fruits, vegetable, fish, lean meats, nuts, and olive oil. Limit salt. -recommend moderate walking, 3-5 times/week for 30-50 minutes each session. Aim for at least 150 minutes.week. Goal should be pace of 3 miles/hours, or walking 1.5 miles in 30 minutes -recommend avoidance of tobacco products. Avoid excess alcohol. -ASCVD risk score: The ASCVD Risk score Mikey Bussing DC Jr., et al., 2013) failed to calculate for the following reasons:   The 2013 ASCVD risk score is only valid for ages 22 to 41    Plan for follow up: ~2 weeks for follow up (after echo)  Buford Dresser, MD, PhD [place]  [facility]    Medication Adjustments/Labs and Tests Ordered: Current medicines are reviewed at length with the patient today.  Concerns regarding medicines are outlined above.  Orders Placed This Encounter  Procedures  . EKG 12-Lead  . ECHOCARDIOGRAM COMPLETE  . VAS [place] LOWER EXTREMITY VENOUS (DVT)  No orders of the defined types were placed in this encounter.   Patient Instructions  Medication Instructions:  Your Physician recommend you continue on your current medication as directed.    *If you need a refill on your cardiac medications before your next appointment, please call your pharmacy*   Lab Work: None   Testing/Procedures: Your physician has requested that you have a lower extremity venous duplex. This test is an ultrasound of the veins in the legs or arms. It looks at venous blood flow that carries blood from the heart to the legs or arms. Allow one hour for a Lower Venous exam. Allow thirty minutes for an Upper Venous exam. There are no restrictions or special instructions. [place]  Your physician has requested that you have an echocardiogram. Echocardiography is a painless test that uses sound waves to create images of your heart. It provides your doctor with information about the size and shape of your heart and how well your heart's chambers and valves are working. This procedure takes approximately one hour. There are no restrictions for this procedure. [address] [address]     Follow-Up: At [facility], you and your health needs are our priority.  As part of our continuing mission to provide you with exceptional heart care, we have created designated Provider Care Teams.  These Care Teams include your primary Cardiologist (physician) and Advanced Practice Providers (APPs -  Physician Assistants and Nurse Practitioners) who all work together to provide you with the care you need, when you need it.  We recommend signing up for the patient portal called "MyChart".  Sign up information is provided on this After Visit Summary.  MyChart is used to connect with patients for Virtual Visits (Telemedicine).  Patients are able to view lab/test results, encounter notes, upcoming appointments, etc.  Non-urgent messages can be sent to your provider as well.   To learn more about what you can do with MyChart, go to [email or web address].    Your next appointment:   2-3 week(s)  The format for your next appointment:   In Person  Provider:   Buford Dresser, MD       Signed, Buford Dresser, MD PhD 06/25/2019  [place]

## 2019-06-30 ENCOUNTER — Ambulatory Visit: Payer: Medicare Other | Admitting: Cardiovascular Disease

## 2019-07-08 ENCOUNTER — Ambulatory Visit (HOSPITAL_COMMUNITY): Payer: Medicare Other | Attending: Cardiology

## 2019-07-08 ENCOUNTER — Other Ambulatory Visit: Payer: Self-pay

## 2019-07-08 DIAGNOSIS — R011 Cardiac murmur, unspecified: Secondary | ICD-10-CM | POA: Diagnosis not present

## 2019-07-09 ENCOUNTER — Telehealth: Payer: Self-pay | Admitting: Cardiology

## 2019-07-09 NOTE — Telephone Encounter (Signed)
Dr. Leonides Schanz is requesting to speak with Dr. Harrell Gave in regards to scheduling a CT for the patient due to her condition. Dr. Jodi Geralds number may be found in [facility]'s secure chat.

## 2019-07-09 NOTE — Telephone Encounter (Signed)
See message regarding secure chat.

## 2019-07-10 ENCOUNTER — Other Ambulatory Visit: Payer: Self-pay | Admitting: Family Medicine

## 2019-07-10 DIAGNOSIS — M7989 Other specified soft tissue disorders: Secondary | ICD-10-CM

## 2019-07-10 NOTE — Telephone Encounter (Signed)
Patient's niece/POA is calling to follow up in regards to CT. Please call.

## 2019-07-10 NOTE — Telephone Encounter (Signed)
POA made of ECHO results and verbalized understanding.

## 2019-07-10 NOTE — Telephone Encounter (Signed)
Dr. Harrell Gave spoke to Dr. Leonides Schanz and provided recommendations.

## 2019-07-14 ENCOUNTER — Encounter: Payer: Self-pay | Admitting: Cardiology

## 2019-07-14 ENCOUNTER — Ambulatory Visit (INDEPENDENT_AMBULATORY_CARE_PROVIDER_SITE_OTHER): Payer: Medicare Other | Admitting: Cardiology

## 2019-07-14 ENCOUNTER — Other Ambulatory Visit: Payer: Self-pay

## 2019-07-14 VITALS — BP 152/70 | HR 76 | Ht 61.0 in | Wt 125.0 lb

## 2019-07-14 DIAGNOSIS — R600 Localized edema: Secondary | ICD-10-CM

## 2019-07-14 DIAGNOSIS — E785 Hyperlipidemia, unspecified: Secondary | ICD-10-CM

## 2019-07-14 DIAGNOSIS — Z712 Person consulting for explanation of examination or test findings: Secondary | ICD-10-CM | POA: Diagnosis not present

## 2019-07-14 DIAGNOSIS — R011 Cardiac murmur, unspecified: Secondary | ICD-10-CM | POA: Diagnosis not present

## 2019-07-14 DIAGNOSIS — I10 Essential (primary) hypertension: Secondary | ICD-10-CM | POA: Diagnosis not present

## 2019-07-14 NOTE — Patient Instructions (Signed)

## 2019-07-14 NOTE — Progress Notes (Signed)
Cardiology Office Note:    Date:  07/14/2019   ID:  Frances Short, DOB 1926-02-22, MRN [id number]  PCP:  Cari Caraway, MD  Cardiologist:  Buford Dresser, MD  Referring MD: Cari Caraway, MD   CC: follow up.   History of Present Illness:    Frances Short is a 86 y.o. female with a hx of LE edema,  hypertension, hypothyroidism, hyperlipidemia who is seen for follow up today. I initially met her 06/25/19 as a new consult at the request of Cari Caraway, MD for the evaluation and management of murmur, LE edema.  Cardiac history: seen 06/25/19, notes several week history of bilateral LE edema. Has long history of heart murmur. Same day ultrasounds were negative for DVT. Echocardiogram without clear cardiac etiology for her swelling, normal EF and strain. Right atrial pressure 3 mmHg.  Today: Legs remain very swollen. Not improved on furosemide. Has a chair coming to keep her legs elevated. We reviewed ultrasounds and echo results together today. Has moderate LVOT gradient, mild-moderate mitral stenosis, mild aortic stenosis. However, EF normal, strain normal, and right atrial pressure is 3. No significant TR to measure RVSP. Her LE edema would be due to elevated right sided filling pressures, which was not suggested on echo.  We discussed venous circulation and lymph circulation. Discussed chronic venous insufficiency and lymphedema. Has CT scheduled this week to evaluate for venous obstruction. If this is unremarkable, we discussed next steps and options.   Denies chest pain, shortness of breath at rest or with normal exertion. No PND, orthopnea, or unexpected weight gain. No syncope or palpitations.  Past Medical History:  Diagnosis Date  . Breast cancer ([place]) 1989   left breast  . Hyperlipidemia   . Hypertension   . Osteopenia   . Personal history of radiation therapy 1989  . Thyroid disease     Past Surgical History:  Procedure Laterality Date  . BREAST  LUMPECTOMY Left 1989  . VAGINAL HYSTERECTOMY      Current Medications: Current Outpatient Medications on File Prior to Visit  Medication Sig  . escitalopram (LEXAPRO) 10 MG tablet Take 10 mg by mouth daily.  . furosemide (LASIX) 20 MG tablet Take 20 mg by mouth daily.  Marland Kitchen levothyroxine (SYNTHROID, LEVOTHROID) 88 MCG tablet Take 88 mcg by mouth daily before breakfast.  . lisinopril (PRINIVIL,ZESTRIL) 10 MG tablet Take 10 mg by mouth daily.  . Multiple Vitamins-Minerals (MULTIVITAL PO) Take by mouth.  . simvastatin (ZOCOR) 80 MG tablet Take 80 mg by mouth daily.  . traMADol (ULTRAM) 50 MG tablet Take 50 mg by mouth 2 (two) times daily as needed.  . triamcinolone (KENALOG) 0.025 % cream APPLY CREAM TOPICALLY TWICE DAILY  . VITAMIN D, CHOLECALCIFEROL, PO Take by mouth.   No current facility-administered medications on file prior to visit.     Allergies:   Erythromycin, Macrodantin [nitrofurantoin macrocrystal], Penicillins, and Septra [sulfamethoxazole-trimethoprim]   Social History   Tobacco Use  . Smoking status: Former Research scientist (life sciences)  . Smokeless tobacco: Never Used  Substance Use Topics  . Alcohol use: No  . Drug use: No    Family History: family history includes Alcoholism in her sister; Coronary artery disease in her father; Heart disease in her sister; Heart failure in her mother.  ROS:   Please see the history of present illness.  Additional pertinent ROS otherwise unremarkable.  EKGs/Labs/Other Studies Reviewed:    The following studies were reviewed today: Venous ultrasound 06/25/19 BILATERAL:  - No evidence of  deep vein thrombosis seen in the lower extremities, bilaterally.  -No evidence of popliteal cyst, bilaterally.   Echo 07/08/19 1. There is a dynamic LVOT/mid LV gradient due to hyperdynamic LVF and  intermittent chordal SAM with gradient of 56mHg at rest and 449mg with  valsalva. Left ventricular ejection fraction, by estimation, is 65 to 70%.  The left ventricle  has normal  function. The left ventricle has no regional wall motion abnormalities.  There is severe left ventricular hypertrophy of the basal-septal segment.  Left ventricular diastolic parameters are indeterminate. Elevated left  ventricular end-diastolic pressure.  The average left ventricular global longitudinal strain is -16.6 %.  2. Right ventricular systolic function is normal. The right ventricular  size is normal. Tricuspid regurgitation signal is inadequate for assessing  PA pressure.  3. Severe mitral annular calcification. Mild mitral valve regurgitation.  Mild to moderate mitral stenosis. The mean mitral valve gradient is 9.0  mmHg and MVA of 2.16cm2 by PHT.  4. The aortic valve is tricuspid. There is moderate thickening and  moderate calcification of the aortic valve. Aortic valve regurgitation is  moderate. No aortic stenosis is present. Aortic valve mean gradient  measures 7.0 mmHg. Aortic valve peak  gradient measures 11.4 mmHg. Aortic valve area, by VTI measures 1.82 cm.  5. The inferior vena cava is normal in size with greater than 50%  respiratory variability, suggesting right atrial pressure of 3 mmHg.   EKG:  EKG is personally reviewed.  The ekg ordered 06/25/19 demonstrates NSR at 77 bpm.  Recent Labs: No results found for requested labs within last 8760 hours.  Recent Lipid Panel No results found for: CHOL, TRIG, HDL, CHOLHDL, VLDL, LDLCALC, LDLDIRECT  Physical Exam:    VS:  BP (!) 152/70   Pulse 76   Ht _0  (1.549 m)   Wt 125 lb (56.7 kg)   SpO2 100%   BMI 23.62 kg/m     Wt Readings from Last 3 Encounters:  07/14/19 125 lb (56.7 kg)  06/25/19 126 lb (57.2 kg)    GEN: Well nourished, well developed in no acute distress HEENT: Normal, moist mucous membranes NECK: JVD at clavicle sitting upright CARDIAC: regular rhythm, normal S1 and S2, no rubs or gallops. 3/6 systolic murmur, increases with valsalva. VASCULAR: Radial pulses 2+  bilaterally. RESPIRATORY:  Clear to auscultation without rales, wheezing or rhonchi  ABDOMEN: Soft, non-tender, non-distended MUSCULOSKELETAL:  Moves all 4 limbs independently SKIN: Chronic skin changes and discoloration on lower extremities bilaterally. 3+ pitting edema with firmness to lower thighs. Warm. NEUROLOGIC:  Alert and oriented x 3. No focal neuro deficits noted. PSYCHIATRIC:  Normal affect   ASSESSMENT:    1. Bilateral leg edema   2. Murmur, cardiac   3. Encounter to discuss test results   4. Essential hypertension   5. Hyperlipidemia, unspecified hyperlipidemia type    PLAN:    Bilateral LE edema: unchanged, reviewed test results to date -per report, came on relatively quickly over a period of weeks -no significant improvement on furosemide -bilateral ultrasounds negative for DVT -echo with mild-moderate valve disease and LVOT gradient, but normal EF and normal right sided filling pressures -workup suggests this is not cardiac in origin -pending CT to evaluate for abdominal venous obstruction per Dr. McAddison Lankif workup unremarkable, suspect this may be venous insufficiency (though speed of onset atypical) -would consider Unna boots through home health/wound care given severity of edema. Once reduced, would recommend compression stockings long term -recommend elevating legs whenever  possible.  Murmur: consistent with LVOT gradient given increase with valsalva -avoid dehydration -also has mild-moderate valve disease but nothing severe yet -continue to monitor  Hypertension: elevated today, but prior 142/70 -continue lisinopril, furosemide for now -consider low dose carvedilol in the future given LVOT gradient  Hyperlipidemia: per [facility], last LDL 76, TG 49 HDL 66 -continue simvastatin  Plan for follow up: 3 mos or sooner based on symptoms  Buford Dresser, MD, PhD [place]  [facility]    Medication Adjustments/Labs and Tests Ordered: Current  medicines are reviewed at length with the patient today.  Concerns regarding medicines are outlined above.  No orders of the defined types were placed in this encounter.  No orders of the defined types were placed in this encounter.   Patient Instructions  Medication Instructions:  Your Physician recommend you continue on your current medication as directed.    *If you need a refill on your cardiac medications before your next appointment, please call your pharmacy*   Lab Work: None   Testing/Procedures: None   Follow-Up: At [facility], you and your health needs are our priority.  As part of our continuing mission to provide you with exceptional heart care, we have created designated Provider Care Teams.  These Care Teams include your primary Cardiologist (physician) and Advanced Practice Providers (APPs -  Physician Assistants and Nurse Practitioners) who all work together to provide you with the care you need, when you need it.  We recommend signing up for the patient portal called "MyChart".  Sign up information is provided on this After Visit Summary.  MyChart is used to connect with patients for Virtual Visits (Telemedicine).  Patients are able to view lab/test results, encounter notes, upcoming appointments, etc.  Non-urgent messages can be sent to your provider as well.   To learn more about what you can do with MyChart, go to [email or web address].    Your next appointment:   3 month(s)  The format for your next appointment:   In Person  Provider:   Buford Dresser, MD      Signed, Buford Dresser, MD PhD 07/14/2019 1:20 PM    [place]

## 2019-07-17 ENCOUNTER — Other Ambulatory Visit: Payer: Self-pay

## 2019-07-17 ENCOUNTER — Ambulatory Visit
Admission: RE | Admit: 2019-07-17 | Discharge: 2019-07-17 | Disposition: A | Discharge status: Home / Self Care | Payer: Medicare Other | Source: Ambulatory Visit | Attending: Family Medicine | Admitting: Family Medicine

## 2019-07-17 DIAGNOSIS — M7989 Other specified soft tissue disorders: Secondary | ICD-10-CM

## 2019-07-17 DIAGNOSIS — N281 Cyst of kidney, acquired: Secondary | ICD-10-CM | POA: Diagnosis not present

## 2019-07-17 MED ORDER — IOPAMIDOL (ISOVUE-300) INJECTION 61%
100.0000 mL | Freq: Once | INTRAVENOUS | Status: AC | PRN
  Administered 2019-07-17: 100 mL via INTRAVENOUS

## 2019-07-20 ENCOUNTER — Other Ambulatory Visit (HOSPITAL_COMMUNITY): Payer: Medicare Other

## 2019-07-24 ENCOUNTER — Other Ambulatory Visit: Payer: Medicare Other

## 2019-08-10 ENCOUNTER — Encounter: Payer: Self-pay | Admitting: Cardiology

## 2019-10-14 ENCOUNTER — Ambulatory Visit: Payer: Medicare Other | Admitting: Cardiology

## 2019-11-05 ENCOUNTER — Other Ambulatory Visit: Payer: Self-pay

## 2019-11-05 ENCOUNTER — Ambulatory Visit (INDEPENDENT_AMBULATORY_CARE_PROVIDER_SITE_OTHER): Payer: Medicare Other | Admitting: Cardiology

## 2019-11-05 ENCOUNTER — Encounter: Payer: Self-pay | Admitting: Cardiology

## 2019-11-05 VITALS — BP 150/73 | HR 66 | Temp 97.2°F | Ht 62.0 in | Wt 114.0 lb

## 2019-11-05 DIAGNOSIS — I700 Atherosclerosis of aorta: Secondary | ICD-10-CM | POA: Diagnosis not present

## 2019-11-05 DIAGNOSIS — E785 Hyperlipidemia, unspecified: Secondary | ICD-10-CM

## 2019-11-05 DIAGNOSIS — I2510 Atherosclerotic heart disease of native coronary artery without angina pectoris: Secondary | ICD-10-CM | POA: Insufficient documentation

## 2019-11-05 DIAGNOSIS — I10 Essential (primary) hypertension: Secondary | ICD-10-CM

## 2019-11-05 DIAGNOSIS — R011 Cardiac murmur, unspecified: Secondary | ICD-10-CM

## 2019-11-05 DIAGNOSIS — R600 Localized edema: Secondary | ICD-10-CM | POA: Insufficient documentation

## 2019-11-05 NOTE — Progress Notes (Signed)
Cardiology Office Note:    Date:  11/05/2019   ID:  Frances Short, DOB [birth year], MRN [id number]  PCP:  Cari Caraway, MD  Cardiologist:  Buford Dresser, MD  Referring MD: Cari Caraway, MD   CC: follow up.   History of Present Illness:    Frances Short is a 86 y.o. female with a hx of LE edema,  hypertension, hypothyroidism, hyperlipidemia, aortic atherosclerosis who is seen for follow up today. I initially met her 06/25/19 as a new consult at the request of Cari Caraway, MD for the evaluation and management of murmur, LE edema.  Cardiac history: seen 06/25/19, notes several week history of bilateral LE edema. Has long history of heart murmur. Same day ultrasounds were negative for DVT. Echocardiogram without clear cardiac etiology for her swelling, normal EF and strain. Right atrial pressure 3 mmHg. LVOT gradient due to hyperdynamic function and severe LVH at the basal septum. Mild-moderate MS, mild AS and moderate AR.  Today: Legs slightly improved. Using lasix. Blood pressure elevated today, doesn't check at home. We reviewed chronic venous insufficiency, recommendations for compression and elevation  Legs improved with elevating legs.   Denies chest pain, shortness of breath at rest or with normal exertion. No PND, orthopnea, or unexpected weight gain. No syncope or palpitations.  Past Medical History:  Diagnosis Date  . Breast cancer ([place]) 1989   left breast  . Hyperlipidemia   . Hypertension   . Osteopenia   . Personal history of radiation therapy 1989  . Thyroid disease     Past Surgical History:  Procedure Laterality Date  . BREAST LUMPECTOMY Left 1989  . VAGINAL HYSTERECTOMY      Current Medications: Current Outpatient Medications on File Prior to Visit  Medication Sig  . escitalopram (LEXAPRO) 10 MG tablet Take 10 mg by mouth daily.  . furosemide (LASIX) 20 MG tablet Take 20 mg by mouth daily.  Marland Kitchen levothyroxine (SYNTHROID, LEVOTHROID) 88 MCG  tablet Take 88 mcg by mouth daily before breakfast.  . lisinopril (PRINIVIL,ZESTRIL) 10 MG tablet Take 10 mg by mouth daily.  . Multiple Vitamins-Minerals (MULTIVITAL PO) Take by mouth.  . simvastatin (ZOCOR) 80 MG tablet Take 80 mg by mouth daily.  . traMADol (ULTRAM) 50 MG tablet Take 50 mg by mouth 2 (two) times daily as needed.  . triamcinolone (KENALOG) 0.025 % cream APPLY CREAM TOPICALLY TWICE DAILY  . VITAMIN D, CHOLECALCIFEROL, PO Take by mouth.   No current facility-administered medications on file prior to visit.     Allergies:   Erythromycin, Macrodantin [nitrofurantoin macrocrystal], Penicillins, and Septra [sulfamethoxazole-trimethoprim]   Social History   Tobacco Use  . Smoking status: Former Research scientist (life sciences)  . Smokeless tobacco: Never Used  Substance Use Topics  . Alcohol use: No  . Drug use: No    Family History: family history includes Alcoholism in her sister; Coronary artery disease in her father; Heart disease in her sister; Heart failure in her mother.  ROS:   Please see the history of present illness.  Additional pertinent ROS otherwise unremarkable.  EKGs/Labs/Other Studies Reviewed:    The following studies were reviewed today: CT abd/pelvis 07/18/19 IMPRESSION: 1. No pelvic mass or evidence for venous obstruction to account for the patient's LOWER extremity edema. 2. Acute fracture of the RIGHT transverse process of L5. 3. Comminuted acute to subacute fracture of the RIGHT pubic symphysis. 4. Healing fracture of the RIGHT ischium. 5. Chronic compression fracture at T10. Significant scoliosis and associated degenerative 6. Cardiomegaly  and coronary artery disease. 7. Small LEFT pleural effusion. 8. Status post cholecystectomy. 9. Bilateral renal cysts. 10. Significant stool burden. 11. Hysterectomy. 12. Aortic Atherosclerosis (ICD10-I70.0).  Venous ultrasound 06/25/19 BILATERAL:  - No evidence of deep vein thrombosis seen in the lower extremities,  bilaterally.  -No evidence of popliteal cyst, bilaterally.   Echo 07/08/19 1. There is a dynamic LVOT/mid LV gradient due to hyperdynamic LVF and  intermittent chordal SAM with gradient of 90mmHg at rest and 65mmHg with  valsalva. Left ventricular ejection fraction, by estimation, is 65 to 70%.  The left ventricle has normal  function. The left ventricle has no regional wall motion abnormalities.  There is severe left ventricular hypertrophy of the basal-septal segment.  Left ventricular diastolic parameters are indeterminate. Elevated left  ventricular end-diastolic pressure.  The average left ventricular global longitudinal strain is -16.6 %.  2. Right ventricular systolic function is normal. The right ventricular  size is normal. Tricuspid regurgitation signal is inadequate for assessing  PA pressure.  3. Severe mitral annular calcification. Mild mitral valve regurgitation.  Mild to moderate mitral stenosis. The mean mitral valve gradient is 9.0  mmHg and MVA of 2.16cm2 by PHT.  4. The aortic valve is tricuspid. There is moderate thickening and  moderate calcification of the aortic valve. Aortic valve regurgitation is  moderate. No aortic stenosis is present. Aortic valve mean gradient  measures 7.0 mmHg. Aortic valve peak  gradient measures 11.4 mmHg. Aortic valve area, by VTI measures 1.82 cm.  5. The inferior vena cava is normal in size with greater than 50%  respiratory variability, suggesting right atrial pressure of 3 mmHg.   EKG:  EKG is personally reviewed.  The ekg ordered 06/25/19 demonstrates NSR at 77 bpm.  Recent Labs: No results found for requested labs within last 8760 hours.  Recent Lipid Panel No results found for: CHOL, TRIG, HDL, CHOLHDL, VLDL, LDLCALC, LDLDIRECT  Physical Exam:    VS:  BP (!) 150/73   Pulse 66   Temp (!) 97.2 F (36.2 C)   Ht $R'5\' 2"'Yw$  (1.575 m)   Wt 114 lb (51.7 kg)   SpO2 95%   BMI 20.85 kg/m     Wt Readings from Last 3 Encounters:   11/05/19 114 lb (51.7 kg)  07/14/19 125 lb (56.7 kg)  06/25/19 126 lb (57.2 kg)    GEN: Well nourished, well developed in no acute distress HEENT: Normal, moist mucous membranes NECK: No JVD CARDIAC: regular rhythm, normal S1 and S2, no rubs or gallops. 3/6 systolic murmur, increases with valsalva VASCULAR: Radial and DP pulses 2+ bilaterally. No carotid bruits RESPIRATORY:  Clear to auscultation without rales, wheezing or rhonchi  ABDOMEN: Soft, non-tender, non-distended MUSCULOSKELETAL:  Ambulates independently SKIN: Chronic LE skin changes. 1+ bilateral brawny edema to upper calves. NEUROLOGIC:  Alert and oriented x 3. No focal neuro deficits noted. PSYCHIATRIC:  Normal affect   ASSESSMENT:    1. Bilateral leg edema   2. Murmur, cardiac   3. Aortic atherosclerosis ([place])   4. Coronary artery calcification seen on CT scan   5. Essential hypertension   6. Hyperlipidemia, unspecified hyperlipidemia type    PLAN:    Bilateral LE edema: unchanged, reviewed test results to date -per report, came on relatively quickly over a period of weeks -bilateral ultrasounds negative for DVT -echo with mild-moderate valve disease and LVOT gradient, but normal EF and normal right sided filling pressures -CT without evidence of obstruction -workup suggests this is not cardiac  in origin -discussed chronic venous insufficiency today. Reviewed recommendations for compression, elevation  Murmur: consistent with LVOT gradient given increase with valsalva -avoid dehydration -also has mild-moderate valve disease but nothing severe that needs to be treated.  -given age, frailty, suspect she would not be a candidate for open heart surgery.  -if she develops symptoms, would repeat echo  Hypertension: elevated today -continue lisinopril, furosemide for now -has upcoming follow up with Dr. Addison Lank -would avoid hypotension given LVOT gradient/basal septal hypertrophy -consider low dose carvedilol in  the future given LVOT gradient if BP remains elevated at her PCP visit  Hyperlipidemia, aortic atherosclerosis, coronary artery calcification -continue simvastatin  Plan for follow up: I would be happy to see her as needed in the future  Buford Dresser, MD, PhD [place]  [facility]    Medication Adjustments/Labs and Tests Ordered: Current medicines are reviewed at length with the patient today.  Concerns regarding medicines are outlined above.  No orders of the defined types were placed in this encounter.  No orders of the defined types were placed in this encounter.   Patient Instructions  Medication Instructions:  Your Physician recommend you continue on your current medication as directed.    *If you need a refill on your cardiac medications before your next appointment, please call your pharmacy*   Lab Work: None ordered   Testing/Procedures: None    Follow-Up: At [facility], you and your health needs are our priority.  As part of our continuing mission to provide you with exceptional heart care, we have created designated Provider Care Teams.  These Care Teams include your primary Cardiologist (physician) and Advanced Practice Providers (APPs -  Physician Assistants and Nurse Practitioners) who all work together to provide you with the care you need, when you need it.  We recommend signing up for the patient portal called "MyChart".  Sign up information is provided on this After Visit Summary.  MyChart is used to connect with patients for Virtual Visits (Telemedicine).  Patients are able to view lab/test results, encounter notes, upcoming appointments, etc.  Non-urgent messages can be sent to your provider as well.   To learn more about what you can do with MyChart, go to [email or web address].    Your next appointment:   As needed   The format for your next appointment:   In Person  Provider:   Buford Dresser, MD        Signed, Buford Dresser, MD PhD 11/05/2019 10:36 AM    [place]

## 2019-11-05 NOTE — Patient Instructions (Signed)
Medication Instructions:  Your Physician recommend you continue on your current medication as directed.    *If you need a refill on your cardiac medications before your next appointment, please call your pharmacy*   Lab Work: None ordered   Testing/Procedures: None    Follow-Up: At [facility], you and your health needs are our priority.  As part of our continuing mission to provide you with exceptional heart care, we have created designated Provider Care Teams.  These Care Teams include your primary Cardiologist (physician) and Advanced Practice Providers (APPs -  Physician Assistants and Nurse Practitioners) who all work together to provide you with the care you need, when you need it.  We recommend signing up for the patient portal called "MyChart".  Sign up information is provided on this After Visit Summary.  MyChart is used to connect with patients for Virtual Visits (Telemedicine).  Patients are able to view lab/test results, encounter notes, upcoming appointments, etc.  Non-urgent messages can be sent to your provider as well.   To learn more about what you can do with MyChart, go to [email or web address].    Your next appointment:   As needed   The format for your next appointment:   In Person  Provider:   Buford Dresser, MD

## 2019-12-08 DIAGNOSIS — I872 Venous insufficiency (chronic) (peripheral): Secondary | ICD-10-CM | POA: Diagnosis not present

## 2019-12-08 DIAGNOSIS — E782 Mixed hyperlipidemia: Secondary | ICD-10-CM | POA: Diagnosis not present

## 2019-12-08 DIAGNOSIS — E039 Hypothyroidism, unspecified: Secondary | ICD-10-CM | POA: Diagnosis not present

## 2019-12-08 DIAGNOSIS — E441 Mild protein-calorie malnutrition: Secondary | ICD-10-CM | POA: Diagnosis not present

## 2019-12-21 DIAGNOSIS — M810 Age-related osteoporosis without current pathological fracture: Secondary | ICD-10-CM | POA: Diagnosis not present

## 2020-06-21 DIAGNOSIS — I10 Essential (primary) hypertension: Secondary | ICD-10-CM | POA: Diagnosis not present

## 2020-06-21 DIAGNOSIS — E782 Mixed hyperlipidemia: Secondary | ICD-10-CM | POA: Diagnosis not present

## 2020-06-21 DIAGNOSIS — E559 Vitamin D deficiency, unspecified: Secondary | ICD-10-CM | POA: Diagnosis not present

## 2020-06-21 DIAGNOSIS — E039 Hypothyroidism, unspecified: Secondary | ICD-10-CM | POA: Diagnosis not present

## 2020-06-30 DIAGNOSIS — M810 Age-related osteoporosis without current pathological fracture: Secondary | ICD-10-CM | POA: Diagnosis not present

## 2020-10-04 DIAGNOSIS — Z0000 Encounter for general adult medical examination without abnormal findings: Secondary | ICD-10-CM | POA: Diagnosis not present

## 2020-10-04 DIAGNOSIS — Z1389 Encounter for screening for other disorder: Secondary | ICD-10-CM | POA: Diagnosis not present

## 2021-01-05 DIAGNOSIS — I10 Essential (primary) hypertension: Secondary | ICD-10-CM | POA: Diagnosis not present

## 2021-01-05 DIAGNOSIS — I700 Atherosclerosis of aorta: Secondary | ICD-10-CM | POA: Diagnosis not present

## 2021-01-05 DIAGNOSIS — E782 Mixed hyperlipidemia: Secondary | ICD-10-CM | POA: Diagnosis not present

## 2021-01-05 DIAGNOSIS — M75101 Unspecified rotator cuff tear or rupture of right shoulder, not specified as traumatic: Secondary | ICD-10-CM | POA: Diagnosis not present

## 2021-01-05 DIAGNOSIS — E039 Hypothyroidism, unspecified: Secondary | ICD-10-CM | POA: Diagnosis not present

## 2021-01-17 ENCOUNTER — Ambulatory Visit: Payer: Medicare Other | Attending: Family Medicine | Admitting: Physical Therapy

## 2021-01-17 ENCOUNTER — Other Ambulatory Visit: Payer: Self-pay

## 2021-01-17 ENCOUNTER — Encounter: Payer: Self-pay | Admitting: Physical Therapy

## 2021-01-17 DIAGNOSIS — M6281 Muscle weakness (generalized): Secondary | ICD-10-CM | POA: Diagnosis not present

## 2021-01-17 DIAGNOSIS — R293 Abnormal posture: Secondary | ICD-10-CM | POA: Diagnosis not present

## 2021-01-17 DIAGNOSIS — M79621 Pain in right upper arm: Secondary | ICD-10-CM

## 2021-01-17 DIAGNOSIS — R262 Difficulty in walking, not elsewhere classified: Secondary | ICD-10-CM | POA: Diagnosis not present

## 2021-01-17 DIAGNOSIS — M79622 Pain in left upper arm: Secondary | ICD-10-CM | POA: Diagnosis not present

## 2021-01-17 NOTE — Therapy (Signed)
[place]. [place], [place], [address] Phone: [phone]   Fax:  [phone]  Physical Therapy Evaluation  Patient Details  Name: Frances Short MRN: [id number] Date of Birth: 03-15-1926 Referring Provider (PT): Cari Caraway, MD   Encounter Date: 01/17/2021   PT End of Session - 01/17/21 0945     Visit Number 1    Number of Visits 4    Date for PT Re-Evaluation 02/14/21    Authorization Type BCBS Medicare    Progress Note Due on Visit 10    PT Start Time 0955    PT Stop Time 1043    PT Time Calculation (min) 48 min    Activity Tolerance Patient tolerated treatment well    Behavior During Therapy [facility] for tasks assessed/performed             Past Medical History:  Diagnosis Date   Breast cancer ([place]) 1989   left breast   Hyperlipidemia    Hypertension    Osteopenia    Personal history of radiation therapy 1989   Thyroid disease     Past Surgical History:  Procedure Laterality Date   BREAST LUMPECTOMY Left 1989   VAGINAL HYSTERECTOMY      There were no vitals filed for this visit.    Subjective Assessment - 01/17/21 1001     Subjective Pt reports intermittent pain in front of bilat arms (points along bicep). Pt and family thinks it might be from bearing down too hard on her walker. Pt notes no pain at this time.    Limitations Walking    How long can you sit comfortably? n/a    How long can you stand comfortably? n/a    How long can you walk comfortably? n/a    Patient Stated Goals Improve intermittent pain    Currently in Pain? No/denies                [facility] PT Assessment - 01/17/21 0001       Assessment   Medical Diagnosis M75.101 (ICD-10-CM) - Unspecified rotator cuff tear or rupture of right shoulder, not specified as traumatic    Referring Provider (PT) Cari Caraway, MD    Onset Date/Surgical Date --   Insidious onset   Hand Dominance Right    Prior Therapy None       Precautions   Precautions None      Restrictions   Weight Bearing Restrictions No      Balance Screen   Has the patient fallen in the past 6 months No      [place] residence    Living Arrangements Alone    Available Help at Discharge Family    Type of [place]      Prior Function   Level of Independence Independent with basic ADLs;Needs assistance with homemaking   Family assists with meals as needed, grocery shopping, and cleaning   Vocation Retired    Leisure Walking (normally walks 2 houses down in her neighborhood)      Observation/Other Assessments   Focus on Therapeutic Outcomes (FOTO)  n/a      Posture/Postural Control   Posture/Postural Control Postural limitations    Postural Limitations Rounded Shoulders;Forward head;Increased thoracic kyphosis      ROM / Strength   AROM / PROM / Strength AROM;Strength      AROM   Overall AROM Comments Shoulder AROM grossly WFL (no pain with  any abd or flexion)      Strength   Strength Assessment Site Shoulder    Right/Left Shoulder Right;Left    Right Shoulder Flexion 4+/5    Right Shoulder Extension 4/5    Right Shoulder ABduction 4/5   slight pain   Right Shoulder Internal Rotation 4/5    Right Shoulder External Rotation 4-/5    Right Shoulder Horizontal ABduction 4+/5    Right Shoulder Horizontal ADduction 4+/5    Left Shoulder Flexion 4+/5    Left Shoulder Extension 4/5    Left Shoulder ABduction 4/5   slight pain   Left Shoulder Internal Rotation 4/5    Left Shoulder External Rotation 4-/5    Left Shoulder Horizontal ABduction --   4+/5   Left Shoulder Horizontal ADduction 4+/5      Palpation   Palpation comment No pain upon palpation today.      Transfers   Five time sit to stand comments  10 sec using UEs; 24 sec without UEs      Ambulation/Gait   Ambulation Distance (Feet) 400 Feet    Assistive device 4-wheeled walker    Gait Pattern Step-through pattern     Ambulation Surface Level;Indoor      Standardized Balance Assessment   Standardized Balance Assessment Berg Balance Test;Dynamic Gait Index      Berg Balance Test   Sit to Stand Able to stand  independently using hands    Standing Unsupported Able to stand safely 2 minutes    Sitting with Back Unsupported but Feet Supported on Floor or Stool Able to sit safely and securely 2 minutes    Stand to Sit Sits safely with minimal use of hands    Transfers Able to transfer safely, minor use of hands    Standing Unsupported with Eyes Closed Able to stand 10 seconds safely    Standing Unsupported with Feet Together Able to place feet together independently and stand 1 minute safely    From Standing, Reach Forward with Outstretched Arm Can reach confidently >25 cm (10")    From Standing Position, Pick up Object from Floor Able to pick up shoe safely and easily    From Standing Position, Turn to Look Behind Over each Shoulder Turn sideways only but maintains balance    Turn 360 Degrees Able to turn 360 degrees safely but slowly    Standing Unsupported, Alternately Place Feet on Step/Stool Able to complete 4 steps without aid or supervision   limited due to fear of fall   Standing Unsupported, One Foot in Front Able to plae foot ahead of the other independently and hold 30 seconds    Standing on One Leg Tries to lift leg/unable to hold 3 seconds but remains standing independently    Total Score 45    Berg comment: <45 indicates high fall risk      Dynamic Gait Index   Level Surface Mild Impairment   uses RW   Change in Gait Speed Normal    Gait with Horizontal Head Turns Normal    Gait with Vertical Head Turns Normal    Gait and Pivot Turn Normal    Step Over Obstacle Mild Impairment    Step Around Obstacles Normal    Steps Moderate Impairment    Total Score 20    DGI comment: 20/24. <19 predictive of falls                        Objective  measurements completed on examination:  See above findings.                PT Education - 01/17/21 1053     Education Details Discussed using 4 wheel walker. Discussed her balance testing results and pt's risk for falls. Discussed walking with 4 wheel walker with supervision first indoors and outdoors. Additionally lowered her 2 wheel walker to lowest setting to decrease shoulder elevation. Discussed PT as needed if pt continues to get arm pain.    Person(s) Educated Patient;Child(ren)    Methods Explanation;Demonstration;Tactile cues;Verbal cues    Comprehension Verbalized understanding;Returned demonstration;Verbal cues required;Tactile cues required                 PT Long Term Goals - 01/17/21 1101       PT LONG TERM GOAL #1   Title Pt will be able to amb mod I with 4 wheel walker    Time 4    Period Weeks    Status New    Target Date 02/14/21      PT LONG TERM GOAL #2   Title Pt will report no instances of anterior arm pain    Time 4    Period Weeks    Status New    Target Date 02/14/21                    Plan - 01/17/21 1054     Clinical Impression Statement Frances Short is a 87 y/o F presenting to OPPT due to complaint of intermittent UE pain along biceps. No pain on assessment with any shoulder movement except abduction with resistance. Shoulder strength grossly WFL for her age range with some mild strength deficits in posterior shoulder girdle. Static and dynamic balance testing yields pt just above cut off score to be considered a true high fall risk based on Berg Balance Test and DGI. Discussed using 4 wheel walker on her carpet to decrease the friction she has to push against at home to reduce any bicep/shoulder strain. Trialed 4 wheel walker indoors and  outdoors on pavement/bricks with good pt tolerance (pt would like to return to her walks outside) and stability. Discussed with pt's daughter to trial a 4 wheel walker at home and see if this helps with her intermittent pain;  otherwise, may consider further shoulder strengthening. Pt and family will schedule if needed.    Personal Factors and Comorbidities Age;Fitness;Time since onset of injury/illness/exacerbation    Examination-Activity Limitations Locomotion Level    Examination-Participation Restrictions Community Activity    Stability/Clinical Decision Making Stable/Uncomplicated    Clinical Decision Making Low    Rehab Potential Good    PT Frequency --   as needed   PT Duration 4 weeks    PT Treatment/Interventions ADLs/Self Care Home Management;Gait training;Stair training;Functional mobility training;Therapeutic activities;Therapeutic exercise;Balance training;Neuromuscular re-education;Patient/family education;Taping    PT Next Visit Plan If pt/family schedules again please strengthen shoulders and biceps/triceps    PT Home Exercise Plan Bicep/pec stretch against door way, trial of 4 wheel walker    Consulted and Agree with Plan of Care Patient             Patient will benefit from skilled therapeutic intervention in order to improve the following deficits and impairments:  Difficulty walking, Pain  Visit Diagnosis: Abnormal posture  Difficulty in walking, not elsewhere classified  Muscle weakness (generalized)  Pain in left upper arm  Pain in right upper arm  Problem List Patient Active Problem List   Diagnosis Date Noted   Bilateral leg edema 11/05/2019   Murmur, cardiac 11/05/2019   Aortic atherosclerosis ([place]) 11/05/2019   Coronary artery calcification seen on CT scan 11/05/2019   Hypertension    Hyperlipidemia     Jahzara Slattery April Ma L Deyjah Kindel, PT, DPT 01/17/2021, 11:08 AM  [address]. [place], [place], [address] Phone: [phone]   Fax:  [phone]  Name: Narya Beavin MRN: [id number] Date of Birth: [birth year]

## 2021-02-20 ENCOUNTER — Telehealth: Payer: Self-pay | Admitting: Cardiology

## 2021-02-20 NOTE — Telephone Encounter (Signed)
Spoke with patients niece Santiago Glad, ok per DPR   Seen in September for swelling, and they got better   Niece says pt. Says her feet have been swelling for two weeks. When she presses on her legs tingle or feel bruised. No heat or pain.     Per Laurann Montana, NP double lasix for 3 days and call us in one week if not better.    Patient's niece verbalizes understanding. Patient will start 40mg  daily tomorrow 1/17 and call us in one week if swelling not getting better!

## 2021-02-20 NOTE — Telephone Encounter (Signed)
Pt c/o swelling: STAT is pt has developed SOB within 24 hours  If swelling, where is the swelling located? Both legs  How much weight have you gained and in what time span? Not sure. Patient does not weigh herself daily  Have you gained 3 pounds in a day or 5 pounds in a week?   Do you have a log of your daily weights (if so, list)? no  Are you currently taking a fluid pill? Yes  Are you currently SOB? no  Have you traveled recently? No  Patient's niece Santiago Glad called. The niece buys groceries for the patient and when she dropped off her recent groceries that's when the patient told her niece about her swelling.

## 2021-02-27 ENCOUNTER — Telehealth (HOSPITAL_BASED_OUTPATIENT_CLINIC_OR_DEPARTMENT_OTHER): Payer: Self-pay

## 2021-02-27 DIAGNOSIS — D509 Iron deficiency anemia, unspecified: Secondary | ICD-10-CM | POA: Diagnosis not present

## 2021-02-27 DIAGNOSIS — L03119 Cellulitis of unspecified part of limb: Secondary | ICD-10-CM | POA: Diagnosis not present

## 2021-02-27 DIAGNOSIS — R600 Localized edema: Secondary | ICD-10-CM | POA: Diagnosis not present

## 2021-02-27 DIAGNOSIS — I872 Venous insufficiency (chronic) (peripheral): Secondary | ICD-10-CM | POA: Diagnosis not present

## 2021-02-27 NOTE — Telephone Encounter (Addendum)
Called patient back this morning to see if her swelling has gotten any better. Spoke with the patient's niece, Santiago Glad, who says her swelling has gotten no better and is in fact worse.   Santiago Glad states they are currently at a doctors appointment with the patient's PCP and will call us later this afternoon to get an appointment for further evaluation for adjustment of medications!   Patient previously put on 40mg  of lasix for three days with no improvement!   ----- Message from Gerald Stabs, RN sent at 02/20/2021  4:48 PM EST ----- See if swelling is any better

## 2021-02-28 ENCOUNTER — Other Ambulatory Visit (HOSPITAL_COMMUNITY): Payer: Self-pay | Admitting: Family Medicine

## 2021-02-28 ENCOUNTER — Other Ambulatory Visit: Payer: Self-pay | Admitting: Family Medicine

## 2021-02-28 DIAGNOSIS — D509 Iron deficiency anemia, unspecified: Secondary | ICD-10-CM

## 2021-02-28 DIAGNOSIS — R600 Localized edema: Secondary | ICD-10-CM

## 2021-03-03 ENCOUNTER — Other Ambulatory Visit (HOSPITAL_COMMUNITY): Payer: Self-pay | Admitting: Family Medicine

## 2021-03-03 DIAGNOSIS — R600 Localized edema: Secondary | ICD-10-CM

## 2021-03-03 DIAGNOSIS — L03119 Cellulitis of unspecified part of limb: Secondary | ICD-10-CM | POA: Diagnosis not present

## 2021-03-03 DIAGNOSIS — Z79899 Other long term (current) drug therapy: Secondary | ICD-10-CM | POA: Diagnosis not present

## 2021-03-03 DIAGNOSIS — D509 Iron deficiency anemia, unspecified: Secondary | ICD-10-CM | POA: Diagnosis not present

## 2021-03-03 NOTE — Telephone Encounter (Signed)
Niece Frances Short returned call. Patient has been in to see PCP and was put on antibiotics for sores on the legs and a topical cream. Niece endorsees that swelling is getting better. They are returning to the PCP today to check anemia status with potential for an abdominal CT scan potentially scheduled.    Patient does not want to be seen with Cardiology at this time but will call us back if she needs to be seen according to the PCP

## 2021-03-03 NOTE — Telephone Encounter (Signed)
Called all numbers on patient file, all to VM will reattempt!

## 2021-03-03 NOTE — Telephone Encounter (Signed)
Just following up--I'd recommend they get scheduled for an appt.

## 2021-03-06 ENCOUNTER — Other Ambulatory Visit: Payer: Self-pay

## 2021-03-06 ENCOUNTER — Ambulatory Visit (HOSPITAL_COMMUNITY)
Admission: RE | Admit: 2021-03-06 | Discharge: 2021-03-06 | Disposition: A | Discharge status: Home / Self Care | Payer: Medicare Other | Source: Ambulatory Visit | Attending: Family Medicine | Admitting: Family Medicine

## 2021-03-06 ENCOUNTER — Encounter (HOSPITAL_COMMUNITY): Payer: Medicare Other

## 2021-03-06 DIAGNOSIS — R600 Localized edema: Secondary | ICD-10-CM | POA: Diagnosis not present

## 2021-03-06 NOTE — Progress Notes (Signed)
Bilateral lower extremity venous duplex completed. Refer to "CV Proc" under chart review to view preliminary results.  03/06/2021 11:29 AM Kelby Aline., MHA, RVT, RDCS, RDMS

## 2021-03-16 ENCOUNTER — Other Ambulatory Visit: Payer: Self-pay

## 2021-03-16 ENCOUNTER — Ambulatory Visit (HOSPITAL_BASED_OUTPATIENT_CLINIC_OR_DEPARTMENT_OTHER)
Admission: RE | Admit: 2021-03-16 | Discharge: 2021-03-16 | Disposition: A | Discharge status: Home / Self Care | Payer: Medicare Other | Source: Ambulatory Visit | Attending: Family Medicine | Admitting: Family Medicine

## 2021-03-16 DIAGNOSIS — I700 Atherosclerosis of aorta: Secondary | ICD-10-CM | POA: Diagnosis not present

## 2021-03-16 DIAGNOSIS — J90 Pleural effusion, not elsewhere classified: Secondary | ICD-10-CM | POA: Diagnosis not present

## 2021-03-16 DIAGNOSIS — N281 Cyst of kidney, acquired: Secondary | ICD-10-CM | POA: Diagnosis not present

## 2021-03-16 DIAGNOSIS — R600 Localized edema: Secondary | ICD-10-CM | POA: Insufficient documentation

## 2021-03-16 DIAGNOSIS — D509 Iron deficiency anemia, unspecified: Secondary | ICD-10-CM | POA: Insufficient documentation

## 2021-03-16 DIAGNOSIS — K5730 Diverticulosis of large intestine without perforation or abscess without bleeding: Secondary | ICD-10-CM | POA: Insufficient documentation

## 2021-04-04 DIAGNOSIS — Z79899 Other long term (current) drug therapy: Secondary | ICD-10-CM | POA: Diagnosis not present

## 2021-04-04 DIAGNOSIS — D509 Iron deficiency anemia, unspecified: Secondary | ICD-10-CM | POA: Diagnosis not present

## 2021-04-06 DIAGNOSIS — D696 Thrombocytopenia, unspecified: Secondary | ICD-10-CM | POA: Diagnosis not present

## 2021-04-06 DIAGNOSIS — D509 Iron deficiency anemia, unspecified: Secondary | ICD-10-CM | POA: Diagnosis not present

## 2021-04-06 DIAGNOSIS — I872 Venous insufficiency (chronic) (peripheral): Secondary | ICD-10-CM | POA: Diagnosis not present

## 2021-04-06 DIAGNOSIS — R600 Localized edema: Secondary | ICD-10-CM | POA: Diagnosis not present

## 2021-07-07 DIAGNOSIS — R600 Localized edema: Secondary | ICD-10-CM | POA: Diagnosis not present

## 2021-07-07 DIAGNOSIS — M810 Age-related osteoporosis without current pathological fracture: Secondary | ICD-10-CM | POA: Diagnosis not present

## 2021-07-07 DIAGNOSIS — M818 Other osteoporosis without current pathological fracture: Secondary | ICD-10-CM | POA: Diagnosis not present

## 2021-07-07 DIAGNOSIS — E782 Mixed hyperlipidemia: Secondary | ICD-10-CM | POA: Diagnosis not present

## 2021-07-07 DIAGNOSIS — I10 Essential (primary) hypertension: Secondary | ICD-10-CM | POA: Diagnosis not present

## 2021-07-07 DIAGNOSIS — E039 Hypothyroidism, unspecified: Secondary | ICD-10-CM | POA: Diagnosis not present

## 2021-07-07 DIAGNOSIS — D509 Iron deficiency anemia, unspecified: Secondary | ICD-10-CM | POA: Diagnosis not present

## 2021-07-20 DIAGNOSIS — L03119 Cellulitis of unspecified part of limb: Secondary | ICD-10-CM | POA: Diagnosis not present

## 2021-07-20 DIAGNOSIS — Z79899 Other long term (current) drug therapy: Secondary | ICD-10-CM | POA: Diagnosis not present

## 2021-07-20 DIAGNOSIS — R600 Localized edema: Secondary | ICD-10-CM | POA: Diagnosis not present

## 2021-07-20 DIAGNOSIS — I872 Venous insufficiency (chronic) (peripheral): Secondary | ICD-10-CM | POA: Diagnosis not present

## 2021-07-27 DIAGNOSIS — Z79899 Other long term (current) drug therapy: Secondary | ICD-10-CM | POA: Diagnosis not present

## 2021-07-27 DIAGNOSIS — R600 Localized edema: Secondary | ICD-10-CM | POA: Diagnosis not present

## 2021-07-27 DIAGNOSIS — I872 Venous insufficiency (chronic) (peripheral): Secondary | ICD-10-CM | POA: Diagnosis not present

## 2021-08-03 DIAGNOSIS — I872 Venous insufficiency (chronic) (peripheral): Secondary | ICD-10-CM | POA: Diagnosis not present

## 2021-08-03 DIAGNOSIS — L270 Generalized skin eruption due to drugs and medicaments taken internally: Secondary | ICD-10-CM | POA: Diagnosis not present

## 2021-08-03 DIAGNOSIS — R600 Localized edema: Secondary | ICD-10-CM | POA: Diagnosis not present

## 2021-08-07 DIAGNOSIS — L270 Generalized skin eruption due to drugs and medicaments taken internally: Secondary | ICD-10-CM | POA: Diagnosis not present

## 2021-08-07 DIAGNOSIS — I872 Venous insufficiency (chronic) (peripheral): Secondary | ICD-10-CM | POA: Diagnosis not present

## 2021-08-07 DIAGNOSIS — R600 Localized edema: Secondary | ICD-10-CM | POA: Diagnosis not present

## 2021-08-10 DIAGNOSIS — R600 Localized edema: Secondary | ICD-10-CM | POA: Diagnosis not present

## 2021-08-15 DIAGNOSIS — R600 Localized edema: Secondary | ICD-10-CM | POA: Diagnosis not present

## 2021-08-15 DIAGNOSIS — I080 Rheumatic disorders of both mitral and aortic valves: Secondary | ICD-10-CM | POA: Diagnosis not present

## 2021-08-15 DIAGNOSIS — L270 Generalized skin eruption due to drugs and medicaments taken internally: Secondary | ICD-10-CM | POA: Diagnosis not present

## 2021-08-15 DIAGNOSIS — I872 Venous insufficiency (chronic) (peripheral): Secondary | ICD-10-CM | POA: Diagnosis not present

## 2021-08-18 ENCOUNTER — Ambulatory Visit (HOSPITAL_BASED_OUTPATIENT_CLINIC_OR_DEPARTMENT_OTHER): Payer: Medicare Other | Admitting: Cardiology

## 2021-08-22 DIAGNOSIS — R600 Localized edema: Secondary | ICD-10-CM | POA: Diagnosis not present

## 2021-08-22 DIAGNOSIS — G479 Sleep disorder, unspecified: Secondary | ICD-10-CM | POA: Diagnosis not present

## 2021-08-22 DIAGNOSIS — I872 Venous insufficiency (chronic) (peripheral): Secondary | ICD-10-CM | POA: Diagnosis not present

## 2021-08-24 ENCOUNTER — Encounter (HOSPITAL_BASED_OUTPATIENT_CLINIC_OR_DEPARTMENT_OTHER): Payer: Self-pay | Admitting: Family

## 2021-08-24 ENCOUNTER — Ambulatory Visit (HOSPITAL_BASED_OUTPATIENT_CLINIC_OR_DEPARTMENT_OTHER): Payer: Medicare Other | Admitting: Family

## 2021-08-24 VITALS — BP 120/52 | HR 64 | Ht 62.0 in | Wt 101.8 lb

## 2021-08-24 DIAGNOSIS — I350 Nonrheumatic aortic (valve) stenosis: Secondary | ICD-10-CM

## 2021-08-24 DIAGNOSIS — R600 Localized edema: Secondary | ICD-10-CM

## 2021-08-24 DIAGNOSIS — I050 Rheumatic mitral stenosis: Secondary | ICD-10-CM | POA: Diagnosis not present

## 2021-08-24 NOTE — Progress Notes (Signed)
Office Visit    Patient Name: Frances Short Date of Encounter: 08/24/2021  PCP:  Cari Caraway, [facility]  Cardiologist:  Buford Dresser, MD  Advanced Practice Provider:  No care team member to display Electrophysiologist:  None      Chief Complaint    Stepahnie Short is a 88 y.o. female presents today for edema   Past Medical History    Past Medical History:  Diagnosis Date   Breast cancer [facility]) 1989   left breast   Hyperlipidemia    Hypertension    Osteopenia    Personal history of radiation therapy 1989   Thyroid disease    Past Surgical History:  Procedure Laterality Date   BREAST LUMPECTOMY Left 1989   VAGINAL HYSTERECTOMY      Allergies  Allergies  Allergen Reactions   Erythromycin    Macrodantin [Nitrofurantoin Macrocrystal]    Penicillins    Septra [Sulfamethoxazole-Trimethoprim]     History of Present Illness    [name] is a 88 y.o. female with a hx of LE edema, HTN, hypothyroidism, HLD, aortic atherosclerosis last seen 11/05/19.  Echo 06/2019 normal LVEF and strain, RA pressure 3 mmHg, LVOT gradient due to hyperdynamic function and severe LVH at basal septum, mild-moderate MS, mild AS and moderate AR.   Presents today for follow up with her niece, Frances Short. Notes worsening edema for the last 3-4 months. Improved with Unna boots as ordered by PCP. Torsemide has been gradually increased from '5mg'$  to '20mg'$  QD. Still with swelling above the compression socks. THis most recent time just wrapped, but no medicine underneath like previous Unna boots. Denies dyspnea, orthopnea, PND, chest pain. Does sleep in recliner with feet up. Enjoys doing crossword puzzles and sudoki in her free time.  EKGs/Labs/Other Studies Reviewed:   The following studies were reviewed today:  EKG:  EKG is  ordered today.  The ekg ordered today demonstrates NSR 64 bpm with no acute St/T wave changes. Minimal voltagecriteria for  LVH.   Recent Labs: No results found for requested labs within last 365 days.  Recent Lipid Panel No results found for: "CHOL", "TRIG", "HDL", "CHOLHDL", "VLDL", "LDLCALC", "LDLDIRECT"   Home Medications   Current Meds  Medication Sig   aspirin 81 MG chewable tablet Chew 1 tablet by mouth daily.   Calcium Carb-Cholecalciferol (CALCIUM 600+D3) 600-20 MG-MCG TABS Take 1 capsule by mouth daily.   denosumab (PROLIA) 60 MG/ML SOSY injection every 6 (six) months.   ELDERBERRY PO Take by mouth daily at 12 noon.   escitalopram (LEXAPRO) 10 MG tablet Take 10 mg by mouth daily.   estradiol (ESTRACE) 0.1 MG/GM vaginal cream 2 (two) times a week.   ketotifen (ZADITOR) 0.025 % ophthalmic solution Place 1 drop into both eyes as needed.   levothyroxine (SYNTHROID, LEVOTHROID) 88 MCG tablet Take 88 mcg by mouth daily before breakfast.   lisinopril (PRINIVIL,ZESTRIL) 10 MG tablet Take 10 mg by mouth daily.   loratadine (CLARITIN) 10 MG tablet Take 1 tablet by mouth daily.   melatonin 5 MG TABS Take 10 mg by mouth at bedtime.   Multiple Vitamins-Minerals (OCUVITE EYE HEALTH FORMULA) CAPS Take 1 capsule by mouth daily.   rosuvastatin (CRESTOR) 20 MG tablet Take 20 mg by mouth daily.   torsemide (DEMADEX) 20 MG tablet Take 1 tablet by mouth daily.   triamcinolone (KENALOG) 0.025 % cream APPLY CREAM TOPICALLY TWICE DAILY   VITAMIN D, CHOLECALCIFEROL, PO Take by  mouth.   [DISCONTINUED] Multiple Vitamins-Minerals (MULTIVITAL PO) Take by mouth.     Review of Systems      All other systems reviewed and are otherwise negative except as noted above.  Physical Exam    VS:  BP (!) 120/52 (BP Location: Right Arm, Patient Position: Sitting, Cuff Size: Normal)   Pulse 64   Ht '5\' 2"'$  (1.575 m)   Wt 101 lb 12.8 oz (46.2 kg)   BMI 18.62 kg/m  , BMI Body mass index is 18.62 kg/m.  Wt Readings from Last 3 Encounters:  08/24/21 101 lb 12.8 oz (46.2 kg)  11/05/19 114 lb (51.7 kg)  07/14/19 125 lb (56.7 kg)      GEN: Well nourished, well developed, in no acute distress. HEENT: normal. Neck: Supple, no JVD, carotid bruits, or masses. Cardiac: RRR, no murmurs, rubs, or gallops. No clubbing, cyanosis. Bilateral LE wrapped in ACE bandage. 1+ pitting bilateral knees. .  Radials/PT 2+ and equal bilaterally.  Respiratory:  Respirations regular and unlabored, clear to auscultation bilaterally. GI: Soft, nontender, nondistended. MS: No deformity or atrophy. Skin: Warm and dry, no rash. Neuro:  Strength and sensation are intact. Psych: Normal affect.  Assessment & Plan    Bilateral LE edema / Murmur / Mitral stenosis - Worsening edema over 2-3 mos has thus far been managed by PCP with much improvement. PCP completed LE duplex with no DVT. Has completed Unna boots. Now with compression wraps. Still with 1+ pitting edema to both knees . Update echo to assess LVEF for consideration of additional medical therapy such as MRA/SGLT2i/ARNI. She does not she would not pursue surgical intervention given her age. No orthponea, PND, chest pain, dyspnea.  Increase torsemide to '20mg'$  BID x 2 days then return to '20mg'$  QD.  Pending echo results and response to increased diuresis could consider addition of MRA.  Continue compression wraps. Plans to buy compression socks with zipper.  HTN - BP well controlled. Continue current antihypertensive regimen.    HLD / CAD / Aortic atherosclerosis - Stable with no anginal symptoms. No indication for ischemic evaluation.  GDMT includes aspirin, rosuvastatin.      Disposition: Follow up in 6 week(s) with Buford Dresser, MD or APP.  Signed, Loel Dubonnet, NP 08/24/2021, 1:52 PM [place]

## 2021-08-24 NOTE — Patient Instructions (Addendum)
Medication Instructions:  Your physician has recommended you make the following change in your medication:   INCREASE Torsemide to one tablet twice per day for 2 days Take in the morning and at lunch time  THEN return to Torsemide to one tablet daily   *If you need a refill on your cardiac medications before your next appointment, please call your pharmacy*   Lab Work: None ordered today.   Testing/Procedures: Your physician has requested that you have an echocardiogram. Echocardiography is a painless test that uses sound waves to create images of your heart. It provides your doctor with information about the size and shape of your heart and how well your heart's chambers and valves are working. This procedure takes approximately one hour. There are no restrictions for this procedure.    Follow-Up: At [facility] you and your health needs are our priority.  As part of our continuing mission to provide you with exceptional heart care, we have created designated Provider Care Teams.  These Care Teams include your primary Cardiologist (physician) and Advanced Practice Providers (APPs -  Physician Assistants and Nurse Practitioners) who all work together to provide you with the care you need, when you need it.  We recommend signing up for the patient portal called "MyChart".  Sign up information is provided on this After Visit Summary.  MyChart is used to connect with patients for Virtual Visits (Telemedicine).  Patients are able to view lab/test results, encounter notes, upcoming appointments, etc.  Non-urgent messages can be sent to your provider as well.   To learn more about what you can do with MyChart, go to [email or web address].    Your next appointment:   6 week(s)  The format for your next appointment:   In Person  Provider:   Buford Dresser, MD or Laurann Montana, NP    Other Instructions  To prevent or reduce lower extremity swelling: Eat a low salt diet. Salt  makes the body hold onto extra fluid which causes swelling. Sit with legs elevated. For example, in the recliner or on an [place].  Wear knee-high compression stockings during the daytime. Ones labeled 15-20 mmHg provide good compression.  Can buy the one with a zipper!

## 2021-08-26 ENCOUNTER — Encounter (HOSPITAL_BASED_OUTPATIENT_CLINIC_OR_DEPARTMENT_OTHER): Payer: Self-pay | Admitting: Family

## 2021-09-04 ENCOUNTER — Telehealth (HOSPITAL_BASED_OUTPATIENT_CLINIC_OR_DEPARTMENT_OTHER): Payer: Self-pay

## 2021-09-04 ENCOUNTER — Ambulatory Visit (INDEPENDENT_AMBULATORY_CARE_PROVIDER_SITE_OTHER): Payer: Medicare Other

## 2021-09-04 DIAGNOSIS — R600 Localized edema: Secondary | ICD-10-CM | POA: Diagnosis not present

## 2021-09-04 LAB — ECHOCARDIOGRAM COMPLETE
AR max vel: 1.35 cm2
AV Area VTI: 1.32 cm2
AV Area mean vel: 1.26 cm2
AV Mean grad: 9 mmHg
AV Peak grad: 16.3 mmHg
AV Vena cont: 0.27 cm
Ao pk vel: 2.02 m/s
Area-P 1/2: 2.54 cm2
MV VTI: 0.89 cm2
P 1/2 time: 458 msec
S' Lateral: 1.3 cm

## 2021-09-04 NOTE — Telephone Encounter (Addendum)
Results called to patient who verbalizes understanding!    ----- Message from Loel Dubonnet, NP sent at 09/04/2021  4:54 PM EDT ----- Echo stable compared to previous.  Noted LVH (thickening of the heart muscle).  Stable mitral stenosis.  No evidence of new heart failure.  Overall stable result.

## 2021-10-05 DIAGNOSIS — Z0000 Encounter for general adult medical examination without abnormal findings: Secondary | ICD-10-CM | POA: Diagnosis not present

## 2021-10-05 DIAGNOSIS — Z1389 Encounter for screening for other disorder: Secondary | ICD-10-CM | POA: Diagnosis not present

## 2021-10-06 ENCOUNTER — Ambulatory Visit (INDEPENDENT_AMBULATORY_CARE_PROVIDER_SITE_OTHER): Payer: Medicare Other | Admitting: Family

## 2021-10-06 ENCOUNTER — Encounter (HOSPITAL_BASED_OUTPATIENT_CLINIC_OR_DEPARTMENT_OTHER): Payer: Self-pay | Admitting: Family

## 2021-10-06 VITALS — BP 170/64 | HR 67 | Ht 62.0 in | Wt 98.5 lb

## 2021-10-06 DIAGNOSIS — R600 Localized edema: Secondary | ICD-10-CM

## 2021-10-06 DIAGNOSIS — I050 Rheumatic mitral stenosis: Secondary | ICD-10-CM | POA: Diagnosis not present

## 2021-10-06 DIAGNOSIS — I700 Atherosclerosis of aorta: Secondary | ICD-10-CM

## 2021-10-06 DIAGNOSIS — I10 Essential (primary) hypertension: Secondary | ICD-10-CM

## 2021-10-06 DIAGNOSIS — E782 Mixed hyperlipidemia: Secondary | ICD-10-CM

## 2021-10-06 MED ORDER — TORSEMIDE 20 MG PO TABS: ORAL_TABLET | ORAL | 3 refills | Status: AC

## 2021-10-06 NOTE — Progress Notes (Signed)
Office Visit    Patient Name: Frances Short Date of Encounter: 10/06/2021  PCP:  Cari Caraway, [place]  Cardiologist:  Buford Dresser, MD  Advanced Practice Provider:  No care team member to display Electrophysiologist:  None      Chief Complaint    Frances Short is a 88 y.o. female presents today for follow up after echocardiogram.   Past Medical History    Past Medical History:  Diagnosis Date   Breast cancer ([place]) 1989   left breast   Hyperlipidemia    Hypertension    Osteopenia    Personal history of radiation therapy 1989   Thyroid disease    Past Surgical History:  Procedure Laterality Date   BREAST LUMPECTOMY Left 1989   VAGINAL HYSTERECTOMY      Allergies  Allergies  Allergen Reactions   Erythromycin    Macrodantin [Nitrofurantoin Macrocrystal]    Penicillins    Septra [Sulfamethoxazole-Trimethoprim]     History of Present Illness    Frances Short is a 88 y.o. female with a hx of LE edema, HTN, hypothyroidism, HLD, aortic atherosclerosis last seen 08/24/21  Echo 06/2019 normal LVEF and strain, RA pressure 3 mmHg, LVOT gradient due to hyperdynamic function and severe LVH at basal septum, mild-moderate MS, mild AS and moderate AR.   Seen 08/24/21  with her niece, Santiago Glad with worsening edema for the last 3-4 months. Improved with Unna boots as ordered by PCP. Torsemide had been gradually increased from '5mg'$  to '20mg'$  QD.  She was recommended for torsemide 20 mg twice daily for 3 days then 20 mg daily.  Updated echo 09/04/2021 stable compared to previous with EF greater than 75%, severe asymmetric LVH, mild MR, moderate to severe mitral stenosis, mild to moderate AI.  Presents today for follow-up with her niece, Santiago Glad.  Her lower extremity edema has resolved.  She has no longer taking torsemide nor requiring any beats.  Notes her legs are sometimes a bit red but this resolves when she puts them up or changes  positions.  No chest pain, dyspnea, orthopnea, PND. Enjoys doing crossword puzzles and sudoki in her free time.  EKGs/Labs/Other Studies Reviewed:   The following studies were reviewed today:  Echo 08/2021  1. Mid cavitary obstruction present up to 50 mmHG. Related to chordal  SAM. Left ventricular ejection fraction, by estimation, is >75%. The left  ventricle has hyperdynamic function. The left ventricle has no regional  wall motion abnormalities. There is  severe asymmetric left ventricular hypertrophy of the basal-septal  segment. Left ventricular diastolic function could not be evaluated.   2. Right ventricular systolic function is normal. The right ventricular  size is normal. Tricuspid regurgitation signal is inadequate for assessing  PA pressure.   3. Left atrial size was severely dilated.   4. A small pericardial effusion is present.   5. The mitral valve is degenerative. Mild mitral valve regurgitation.  Moderate to severe mitral stenosis. The mean mitral valve gradient is 8.0  mmHg with average heart rate of 62 bpm. Severe mitral annular  calcification.   6. The aortic valve is tricuspid. Aortic valve regurgitation is mild to  moderate. Aortic valve sclerosis/calcification is present, without any  evidence of aortic stenosis. Aortic valve mean gradient measures 9.0 mmHg.  Aortic valve Vmax measures 2.02  m/s.   7. The inferior vena cava is normal in size with greater than 50%  respiratory variability, suggesting right atrial  pressure of 3 mmHg.   EKG:  No EKG today.  Recent Labs: No results found for requested labs within last 365 days.  Recent Lipid Panel No results found for: "CHOL", "TRIG", "HDL", "CHOLHDL", "VLDL", "LDLCALC", "LDLDIRECT"  Home Medications   Current Meds  Medication Sig   aspirin 81 MG chewable tablet Chew 1 tablet by mouth daily.   Calcium Carb-Cholecalciferol (CALCIUM 600+D3) 600-20 MG-MCG TABS Take 1 capsule by mouth daily.   denosumab  (PROLIA) 60 MG/ML SOSY injection every 6 (six) months.   ELDERBERRY PO Take by mouth daily at 12 noon.   escitalopram (LEXAPRO) 10 MG tablet Take 10 mg by mouth daily.   estradiol (ESTRACE) 0.1 MG/GM vaginal cream 2 (two) times a week.   ketotifen (ZADITOR) 0.025 % ophthalmic solution Place 1 drop into both eyes as needed.   levothyroxine (SYNTHROID, LEVOTHROID) 88 MCG tablet Take 88 mcg by mouth daily before breakfast.   lisinopril (PRINIVIL,ZESTRIL) 10 MG tablet Take 10 mg by mouth daily.   loratadine (CLARITIN) 10 MG tablet Take 1 tablet by mouth daily.   melatonin 5 MG TABS Take 10 mg by mouth at bedtime.   Multiple Vitamins-Minerals (OCUVITE EYE HEALTH FORMULA) CAPS Take 1 capsule by mouth daily.   rosuvastatin (CRESTOR) 20 MG tablet Take 20 mg by mouth daily.   VITAMIN D, CHOLECALCIFEROL, PO Take by mouth.   [DISCONTINUED] torsemide (DEMADEX) 20 MG tablet Take 1 tablet by mouth daily.   [DISCONTINUED] triamcinolone (KENALOG) 0.025 % cream APPLY CREAM TOPICALLY TWICE DAILY     Review of Systems      All other systems reviewed and are otherwise negative except as noted above.  Physical Exam    VS:  There were no vitals taken for this visit. , BMI There is no height or weight on file to calculate BMI.  Wt Readings from Last 3 Encounters:  08/24/21 101 lb 12.8 oz (46.2 kg)  11/05/19 114 lb (51.7 kg)  07/14/19 125 lb (56.7 kg)     GEN: Well nourished, well developed, in no acute distress. HEENT: normal. Neck: Supple, no JVD, carotid bruits, or masses. Cardiac: RRR, no murmurs, rubs, or gallops. No clubbing, cyanosis. Bilateral LE wrapped in ACE bandage. 1+ pitting bilateral knees. .  Radials/PT 2+ and equal bilaterally.  Respiratory:  Respirations regular and unlabored, clear to auscultation bilaterally. GI: Soft, nontender, nondistended. MS: No deformity or atrophy. Skin: Warm and dry, no rash. Neuro:  Strength and sensation are intact. Psych: Normal affect.  Assessment &  Plan    Bilateral LE edema / Murmur / Mitral stenosis -lower extremity edema now resolved.  Educated to take torsemide 20 mg as needed for lower extremity edema.  Continue compression socks with zipper.  HTN - BP well controlled at clinic visit with PCP yesterday though elevated today.  Notes yesterday in clinic her blood pressure was initially elevated then improved.. Continue current antihypertensive regimen.  I have asked her niece to check at home and contact us if BP is persistently greater than 140/90.  HLD / CAD / Aortic atherosclerosis - Stable with no anginal symptoms. No indication for ischemic evaluation.  GDMT includes aspirin, rosuvastatin.    Disposition: Follow up in 6 month(s) with Buford Dresser, MD or APP.  Signed, Loel Dubonnet, NP 10/06/2021, 10:17 AM [place]

## 2021-10-06 NOTE — Patient Instructions (Addendum)
Medication Instructions:  Your Physician recommend you continue on your current medication as directed.    We have sent refills of your Torsemide to take as needed for swelling or weight gain of 2 pounds or 5 pounds in a week.   *If you need a refill on your cardiac medications before your next appointment, please call your pharmacy*  Follow-Up: At [facility], you and your health needs are our priority.  As part of our continuing mission to provide you with exceptional heart care, we have created designated Provider Care Teams.  These Care Teams include your primary Cardiologist (physician) and Advanced Practice Providers (APPs -  Physician Assistants and Nurse Practitioners) who all work together to provide you with the care you need, when you need it.  We recommend signing up for the patient portal called "MyChart".  Sign up information is provided on this After Visit Summary.  MyChart is used to connect with patients for Virtual Visits (Telemedicine).  Patients are able to view lab/test results, encounter notes, upcoming appointments, etc.  Non-urgent messages can be sent to your provider as well.   To learn more about what you can do with MyChart, go to [email or web address].    Your next appointment:   6 month(s)  The format for your next appointment:   In Person  Provider:   Buford Dresser, MD or Laurann Montana, NP

## 2022-01-08 DIAGNOSIS — I517 Cardiomegaly: Secondary | ICD-10-CM | POA: Diagnosis not present

## 2022-01-08 DIAGNOSIS — I10 Essential (primary) hypertension: Secondary | ICD-10-CM | POA: Diagnosis not present

## 2022-01-08 DIAGNOSIS — Z79899 Other long term (current) drug therapy: Secondary | ICD-10-CM | POA: Diagnosis not present

## 2022-01-08 DIAGNOSIS — E039 Hypothyroidism, unspecified: Secondary | ICD-10-CM | POA: Diagnosis not present

## 2022-01-08 DIAGNOSIS — R600 Localized edema: Secondary | ICD-10-CM | POA: Diagnosis not present

## 2022-01-19 DIAGNOSIS — E876 Hypokalemia: Secondary | ICD-10-CM | POA: Diagnosis not present

## 2022-04-06 ENCOUNTER — Encounter (HOSPITAL_BASED_OUTPATIENT_CLINIC_OR_DEPARTMENT_OTHER): Payer: Self-pay | Admitting: Cardiology

## 2022-04-06 ENCOUNTER — Ambulatory Visit (HOSPITAL_BASED_OUTPATIENT_CLINIC_OR_DEPARTMENT_OTHER): Payer: Medicare Other | Admitting: Cardiology

## 2022-04-06 VITALS — BP 102/48 | HR 65 | Ht 62.0 in | Wt 89.3 lb

## 2022-04-06 DIAGNOSIS — E782 Mixed hyperlipidemia: Secondary | ICD-10-CM | POA: Diagnosis not present

## 2022-04-06 DIAGNOSIS — R600 Localized edema: Secondary | ICD-10-CM

## 2022-04-06 DIAGNOSIS — I10 Essential (primary) hypertension: Secondary | ICD-10-CM

## 2022-04-06 DIAGNOSIS — I050 Rheumatic mitral stenosis: Secondary | ICD-10-CM

## 2022-04-06 DIAGNOSIS — I700 Atherosclerosis of aorta: Secondary | ICD-10-CM

## 2022-04-06 LAB — BASIC METABOLIC PANEL
BUN/Creatinine Ratio: 18 (ref 12–28)
BUN: 25 mg/dL (ref 10–36)
CO2: 17 mmol/L — ABNORMAL LOW (ref 20–29)
Calcium: 9.1 mg/dL (ref 8.7–10.3)
Chloride: 106 mmol/L (ref 96–106)
Creatinine, Ser: 1.42 mg/dL — ABNORMAL HIGH (ref 0.57–1.00)
Glucose: 120 mg/dL — ABNORMAL HIGH (ref 70–99)
Potassium: 4.7 mmol/L (ref 3.5–5.2)
Sodium: 141 mmol/L (ref 134–144)
eGFR: 34 mL/min/{1.73_m2} — ABNORMAL LOW (ref >59)

## 2022-04-06 NOTE — Patient Instructions (Addendum)
Blood pressure is low today, which may be why she feels poorly. We will stop the lisinopril today as this may be making her feel poorly.   Lab Work: BMET TODAY   If you have labs (blood work) drawn today and your tests are completely normal, you will receive your results only by: [employer] (if you have MyChart) OR A paper copy in the mail If you have any lab test that is abnormal or we need to change your treatment, we will call you to review the results.  Testing/Procedures: NONE  Follow-Up: At [facility], you and your health needs are our priority.  As part of our continuing mission to provide you with exceptional heart care, we have created designated Provider Care Teams.  These Care Teams include your primary Cardiologist (physician) and Advanced Practice Providers (APPs -  Physician Assistants and Nurse Practitioners) who all work together to provide you with the care you need, when you need it.  We recommend signing up for the patient portal called "MyChart".  Sign up information is provided on this After Visit Summary.  MyChart is used to connect with patients for Virtual Visits (Telemedicine).  Patients are able to view lab/test results, encounter notes, upcoming appointments, etc.  Non-urgent messages can be sent to your provider as well.   To learn more about what you can do with MyChart, go to [email or web address].    Your next appointment:   12 month(s)  Provider:   Buford Dresser, MD

## 2022-04-06 NOTE — Progress Notes (Signed)
Cardiology Office Note:    Date:  04/06/2022   ID:  Frances Short, DOB Nov 23, 1926, MRN [id number]  PCP:  Frances Caraway, MD  Cardiologist:  Buford Dresser, MD  Referring MD: Frances Caraway, MD   CC: follow up.   History of Present Illness:    Frances Short is a 89 y.o. female with a hx of mitral stenosis, LE edema,  hypertension, hypothyroidism, hyperlipidemia, aortic atherosclerosis who is seen for follow up today. I initially met her 06/25/19 as a new consult at the request of Frances Caraway, MD for the evaluation and management of murmur, LE edema.  Cardiac history: seen 06/25/19, notes several week history of bilateral LE edema. Has long history of heart murmur. Same day ultrasounds were negative for DVT. Echocardiogram without clear cardiac etiology for her swelling, normal EF and strain. Right atrial pressure 3 mmHg. LVOT gradient due to hyperdynamic function and severe LVH at the basal septum. Mild-moderate MS, mild AS and moderate AR.  At her last appointment she was using her Lasix with slight improvement in her swelling. Blood pressure elevated in clinic, doesn't check at home. We reviewed chronic venous insufficiency, recommendations for compression and elevation  On 08/24/2021 she followed up with Laurann Montana, NP. At that visit he had completed Unna boots and had compression wraps in place. She still had 1+ pitting edema to her knees bilaterally. Torsemide was increased to 20 mg BID for 2 days, then return to 20 mg daily. Updated echocardiogram 09/04/2021 revealed LVEF >75%, hyperdynamic left ventricular function, severe asymmetric LVH of the basal-septal segment, stable mitral stenosis. Overall stable with no evidence of new heart failure.  Today, she is accompanied by a family member who provides the history. They note that she had a rough time on the way to the clinic today. While in the lobby she became sick, and spit up a bit. Currently she is feeling fatigued and  nauseated. Occasionally she does have sputum production.  In clinic today her BP was initially lower at 92/50, and was 102/48 on recheck. She does not monitor home blood pressures.   Lately her LE edema has been well managed on her 20 mg torsemide. Additionally she is wearing compression socks regularly.  She denies any palpitations, chest pain, shortness of breath, headaches, syncope, orthopnea, or PND.   Past Medical History:  Diagnosis Date   Breast cancer ([place]) 1989   left breast   Hyperlipidemia    Hypertension    Osteopenia    Personal history of radiation therapy 1989   Thyroid disease     Past Surgical History:  Procedure Laterality Date   BREAST LUMPECTOMY Left 1989   VAGINAL HYSTERECTOMY      Current Medications: Current Outpatient Medications on File Prior to Visit  Medication Sig   aspirin 81 MG chewable tablet Chew 1 tablet by mouth daily.   Calcium Carb-Cholecalciferol (CALCIUM 600+D3) 600-20 MG-MCG TABS Take 1 capsule by mouth daily.   denosumab (PROLIA) 60 MG/ML SOSY injection every 6 (six) months.   ELDERBERRY PO Take by mouth daily at 12 noon.   escitalopram (LEXAPRO) 10 MG tablet Take 10 mg by mouth daily.   estradiol (ESTRACE) 0.1 MG/GM vaginal cream 2 (two) times a week.   ketotifen (ZADITOR) 0.025 % ophthalmic solution Place 1 drop into both eyes as needed.   levothyroxine (SYNTHROID, LEVOTHROID) 88 MCG tablet Take 88 mcg by mouth daily before breakfast.   loratadine (CLARITIN) 10 MG tablet Take 1 tablet by mouth daily.  melatonin 5 MG TABS Take 10 mg by mouth at bedtime.   Multiple Vitamins-Minerals (OCUVITE EYE HEALTH FORMULA) CAPS Take 1 capsule by mouth daily.   rosuvastatin (CRESTOR) 20 MG tablet Take 20 mg by mouth daily.   torsemide (DEMADEX) 20 MG tablet Take '20mg'$  daily as needed for swelling or weight gain of 2 pounds overnight or 5 pounds in a week.   VITAMIN D, CHOLECALCIFEROL, PO Take by mouth.   No current facility-administered  medications on file prior to visit.     Allergies:   Erythromycin, Macrodantin [nitrofurantoin macrocrystal], Penicillins, and Septra [sulfamethoxazole-trimethoprim]   Social History   Tobacco Use   Smoking status: Former   Smokeless tobacco: Never  Substance Use Topics   Alcohol use: No   Drug use: No    Family History: family history includes Alcoholism in her sister; Coronary artery disease in her father; Heart disease in her sister; Heart failure in her mother.  ROS:   Please see the history of present illness.   (+) Fatigue (+) Nausea (+) Vomiting (+) Sputum production (+) BLE edema Additional pertinent ROS otherwise unremarkable.  EKGs/Labs/Other Studies Reviewed:    The following studies were reviewed today:  Echo  09/04/2021:  1. Mid cavitary obstruction present up to 50 mmHG. Related to chordal  SAM. Left ventricular ejection fraction, by estimation, is >75%. The left  ventricle has hyperdynamic function. The left ventricle has no regional  wall motion abnormalities. There is  severe asymmetric left ventricular hypertrophy of the basal-septal  segment. Left ventricular diastolic function could not be evaluated.   2. Right ventricular systolic function is normal. The right ventricular  size is normal. Tricuspid regurgitation signal is inadequate for assessing  PA pressure.   3. Left atrial size was severely dilated.   4. A small pericardial effusion is present.   5. The mitral valve is degenerative. Mild mitral valve regurgitation.  Moderate to severe mitral stenosis. The mean mitral valve gradient is 8.0  mmHg with average heart rate of 62 bpm. Severe mitral annular  calcification.   6. The aortic valve is tricuspid. Aortic valve regurgitation is mild to  moderate. Aortic valve sclerosis/calcification is present, without any  evidence of aortic stenosis. Aortic valve mean gradient measures 9.0 mmHg.  Aortic valve Vmax measures 2.02  m/s.   7. The inferior  vena cava is normal in size with greater than 50%  respiratory variability, suggesting right atrial pressure of 3 mmHg.   Comparison(s): No significant change from prior study.   CT abd/pelvis 07/18/19 IMPRESSION: 1. No pelvic mass or evidence for venous obstruction to account for the patient's LOWER extremity edema. 2. Acute fracture of the RIGHT transverse process of L5. 3. Comminuted acute to subacute fracture of the RIGHT pubic symphysis. 4. Healing fracture of the RIGHT ischium. 5. Chronic compression fracture at T10. Significant scoliosis and associated degenerative 6. Cardiomegaly and coronary artery disease. 7. Small LEFT pleural effusion. 8. Status post cholecystectomy. 9. Bilateral renal cysts. 10. Significant stool burden. 11. Hysterectomy. 12. Aortic Atherosclerosis (ICD10-I70.0).  Venous ultrasound 06/25/19 BILATERAL:  - No evidence of deep vein thrombosis seen in the lower extremities, bilaterally.  -No evidence of popliteal cyst, bilaterally.   Echo 07/08/19 1. There is a dynamic LVOT/mid LV gradient due to hyperdynamic LVF and  intermittent chordal SAM with gradient of 59mHg at rest and 417mg with  valsalva. Left ventricular ejection fraction, by estimation, is 65 to 70%.  The left ventricle has normal  function. The left  ventricle has no regional wall motion abnormalities.  There is severe left ventricular hypertrophy of the basal-septal segment.  Left ventricular diastolic parameters are indeterminate. Elevated left  ventricular end-diastolic pressure.  The average left ventricular global longitudinal strain is -16.6 %.   2. Right ventricular systolic function is normal. The right ventricular  size is normal. Tricuspid regurgitation signal is inadequate for assessing  PA pressure.   3. Severe mitral annular calcification. Mild mitral valve regurgitation.  Mild to moderate mitral stenosis. The mean mitral valve gradient is 9.0  mmHg and MVA of 2.16cm2 by PHT.    4. The aortic valve is tricuspid. There is moderate thickening and  moderate calcification of the aortic valve. Aortic valve regurgitation is  moderate. No aortic stenosis is present. Aortic valve mean gradient  measures 7.0 mmHg. Aortic valve peak  gradient measures 11.4 mmHg. Aortic valve area, by VTI measures 1.82 cm.   5. The inferior vena cava is normal in size with greater than 50%  respiratory variability, suggesting right atrial pressure of 3 mmHg.   EKG:  EKG is personally reviewed.   04/06/2022:  SR with PACs at 65 bpm, LVH 06/25/19: NSR at 77 bpm.  Recent Labs: No results found for requested labs within last 365 days.   Recent Lipid Panel No results found for: "CHOL", "TRIG", "HDL", "CHOLHDL", "VLDL", "LDLCALC", "LDLDIRECT"  Physical Exam:    VS:  BP (!) 102/48 (BP Location: Right Arm, Patient Position: Sitting, Cuff Size: Normal)   Pulse 65   Ht '5\' 2"'$  (1.575 m)   Wt 89 lb 4.8 oz (40.5 kg)   BMI 16.33 kg/m     Wt Readings from Last 3 Encounters:  04/06/22 89 lb 4.8 oz (40.5 kg)  10/06/21 98 lb 8 oz (44.7 kg)  08/24/21 101 lb 12.8 oz (46.2 kg)    GEN: Well nourished, well developed in no acute distress HEENT: Normal, moist mucous membranes NECK: No JVD CARDIAC: regular rhythm with occasional early beats, normal S1 and S2, no rubs or gallops. 3/6 systolic murmur, increases with valsalva. 1/4 diastolic rumble VASCULAR: Radial and DP pulses 2+ bilaterally. No carotid bruits RESPIRATORY:  Clear to auscultation without rales, wheezing or rhonchi  ABDOMEN: Soft, non-tender, non-distended MUSCULOSKELETAL:  In wheelchair. SKIN: Chronic LE skin changes. No edema today NEUROLOGIC:  Alert and oriented x 3. No focal neuro deficits noted. PSYCHIATRIC:  Normal affect   ASSESSMENT:    1. Mitral valve stenosis, unspecified etiology   2. Bilateral lower extremity edema   3. Essential hypertension   4. Mixed hyperlipidemia   5. Aortic atherosclerosis (HCC)     PLAN:     Mitral stenosis, moderate-severe Bilateral LE edema, resolved Murmur: consistent with LVOT gradient  Hypertension: hypotension today -avoid dehydration, appears euvolemic to dry today -stopping lisinopril today given . Check BMET. -need to be cautious with torsemide. They noticed that LE edema recurred with PRN dosing, has done well with daily dosing.  -would avoid hypotension given LVOT gradient/basal septal hypertrophy -no room for beta blocker today  Hyperlipidemia, aortic atherosclerosis, coronary artery calcification -continue simvastatin  Plan for follow up: 1 year or sooner as needed, per family preference. Very difficult to transport her out of the house. They will contact me with questions or concerns.  Buford Dresser, MD, PhD [place]  [facility]    Medication Adjustments/Labs and Tests Ordered: Current medicines are reviewed at length with the patient today.  Concerns regarding medicines are outlined above.   Orders Placed  This Encounter  Procedures   Basic metabolic panel   EKG [id number]   No orders of the defined types were placed in this encounter.  Patient Instructions  Blood pressure is low today, which may be why she feels poorly. We will stop the lisinopril today as this may be making her feel poorly.   Lab Work: BMET TODAY   If you have labs (blood work) drawn today and your tests are completely normal, you will receive your results only by: [employer] (if you have MyChart) OR A paper copy in the mail If you have any lab test that is abnormal or we need to change your treatment, we will call you to review the results.  Testing/Procedures: NONE  Follow-Up: At [facility], you and your health needs are our priority.  As part of our continuing mission to provide you with exceptional heart care, we have created designated Provider Care Teams.  These Care Teams include your primary Cardiologist (physician) and Advanced Practice  Providers (APPs -  Physician Assistants and Nurse Practitioners) who all work together to provide you with the care you need, when you need it.  We recommend signing up for the patient portal called "MyChart".  Sign up information is provided on this After Visit Summary.  MyChart is used to connect with patients for Virtual Visits (Telemedicine).  Patients are able to view lab/test results, encounter notes, upcoming appointments, etc.  Non-urgent messages can be sent to your provider as well.   To learn more about what you can do with MyChart, go to [email or web address].    Your next appointment:   12 month(s)  Provider:   Buford Dresser, MD         [facility],acting as a scribe for Buford Dresser, MD.,have documented all relevant documentation on the behalf of Buford Dresser, MD,as directed by  Buford Dresser, MD while in the presence of Buford Dresser, MD.  I, Buford Dresser, MD, have reviewed all documentation for this visit. The documentation on 04/06/22 for the exam, diagnosis, procedures, and orders are all accurate and complete.   Signed, Buford Dresser, MD PhD 04/06/2022 12:48 PM    [place]

## 2022-04-25 DIAGNOSIS — R64 Cachexia: Secondary | ICD-10-CM | POA: Diagnosis not present

## 2022-04-25 DIAGNOSIS — E43 Unspecified severe protein-calorie malnutrition: Secondary | ICD-10-CM | POA: Diagnosis not present

## 2022-04-25 DIAGNOSIS — I700 Atherosclerosis of aorta: Secondary | ICD-10-CM | POA: Diagnosis not present

## 2022-04-25 DIAGNOSIS — R296 Repeated falls: Secondary | ICD-10-CM | POA: Diagnosis not present

## 2022-04-30 DIAGNOSIS — I872 Venous insufficiency (chronic) (peripheral): Secondary | ICD-10-CM | POA: Diagnosis not present

## 2022-04-30 DIAGNOSIS — I509 Heart failure, unspecified: Secondary | ICD-10-CM | POA: Diagnosis not present

## 2022-04-30 DIAGNOSIS — M810 Age-related osteoporosis without current pathological fracture: Secondary | ICD-10-CM | POA: Diagnosis not present

## 2022-04-30 DIAGNOSIS — Z87891 Personal history of nicotine dependence: Secondary | ICD-10-CM | POA: Diagnosis not present

## 2022-04-30 DIAGNOSIS — I110 Hypertensive heart disease with heart failure: Secondary | ICD-10-CM | POA: Diagnosis not present

## 2022-04-30 DIAGNOSIS — E782 Mixed hyperlipidemia: Secondary | ICD-10-CM | POA: Diagnosis not present

## 2022-04-30 DIAGNOSIS — I700 Atherosclerosis of aorta: Secondary | ICD-10-CM | POA: Diagnosis not present

## 2022-04-30 DIAGNOSIS — F419 Anxiety disorder, unspecified: Secondary | ICD-10-CM | POA: Diagnosis not present

## 2022-04-30 DIAGNOSIS — Z9181 History of falling: Secondary | ICD-10-CM | POA: Diagnosis not present

## 2022-04-30 DIAGNOSIS — E039 Hypothyroidism, unspecified: Secondary | ICD-10-CM | POA: Diagnosis not present

## 2022-04-30 DIAGNOSIS — Z602 Problems related to living alone: Secondary | ICD-10-CM | POA: Diagnosis not present

## 2022-04-30 DIAGNOSIS — Z90710 Acquired absence of both cervix and uterus: Secondary | ICD-10-CM | POA: Diagnosis not present

## 2022-04-30 DIAGNOSIS — E43 Unspecified severe protein-calorie malnutrition: Secondary | ICD-10-CM | POA: Diagnosis not present

## 2022-04-30 DIAGNOSIS — D509 Iron deficiency anemia, unspecified: Secondary | ICD-10-CM | POA: Diagnosis not present

## 2022-04-30 DIAGNOSIS — R296 Repeated falls: Secondary | ICD-10-CM | POA: Diagnosis not present

## 2022-04-30 DIAGNOSIS — Z853 Personal history of malignant neoplasm of breast: Secondary | ICD-10-CM | POA: Diagnosis not present

## 2022-04-30 IMAGING — CT CT ABD-PELV W/ CM
1 of 3 series · 13 of 32 positions shown, 18 images · IV contrast (APPLIED)
Comparison: None.

CLINICAL DATA: Evaluate abdominopelvic obstruction causing
progressive significant bilateral LOWER extremity edema. Symptoms
for 3 months. History of LEFT lumpectomy for breast cancer.

EXAM:
CT ABDOMEN AND PELVIS WITH CONTRAST
TECHNIQUE: Multidetector CT imaging of the abdomen and pelvis was performed
using the standard protocol following bolus administration of
intravenous contrast.
CONTRAST:  100mL GKFTE7-FMM IOPAMIDOL (GKFTE7-FMM) INJECTION 61%

[Series 2: abd/pelvis w/cm · axial · 0.73mm/px · z∈[+575,+965]mm · 13 of 92 slices shown, 18 images]
[im 7/92  soft-tissue]
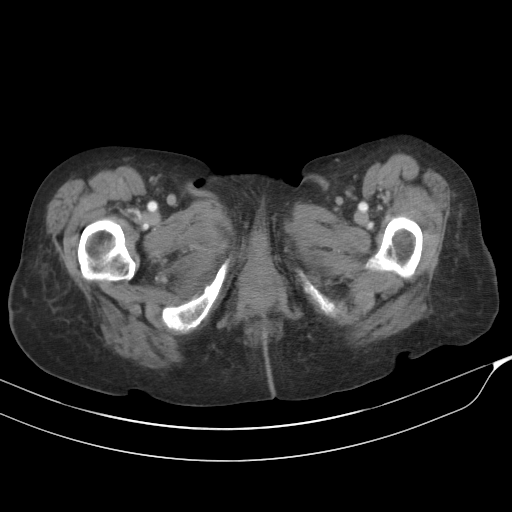
[im 7/92  bone]
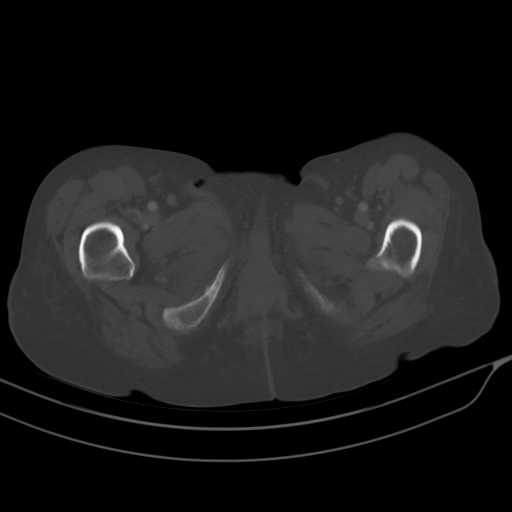
[im 13/92  soft-tissue]
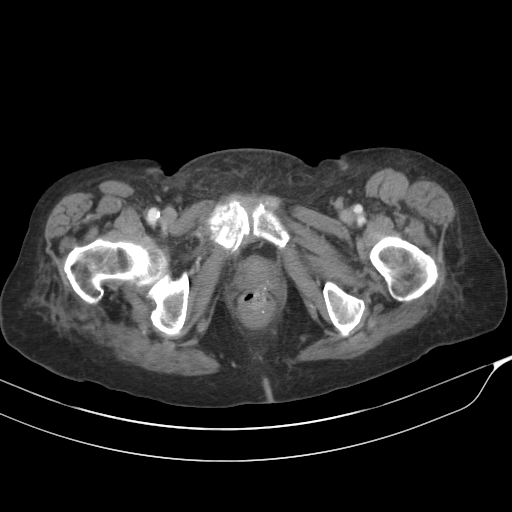
[im 19/92  soft-tissue]
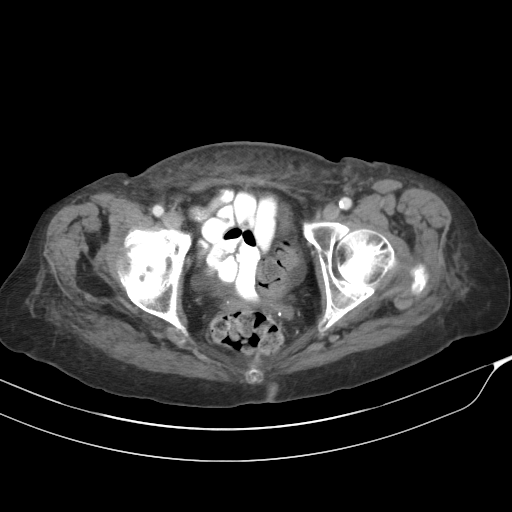
[im 31/92  soft-tissue]
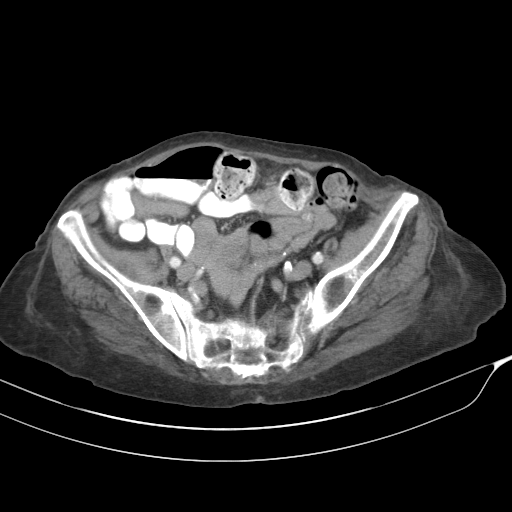
[im 37/92  soft-tissue]
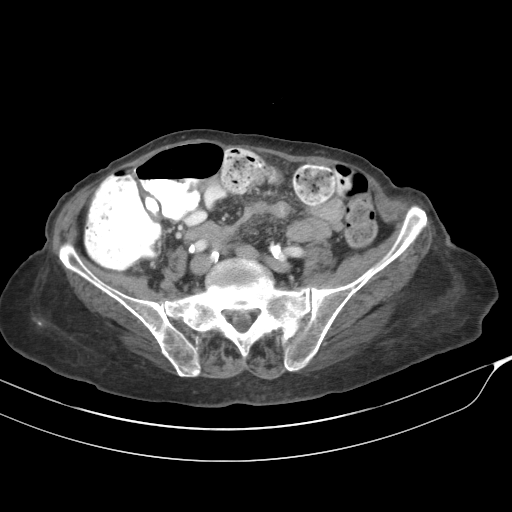
[im 43/92  soft-tissue]
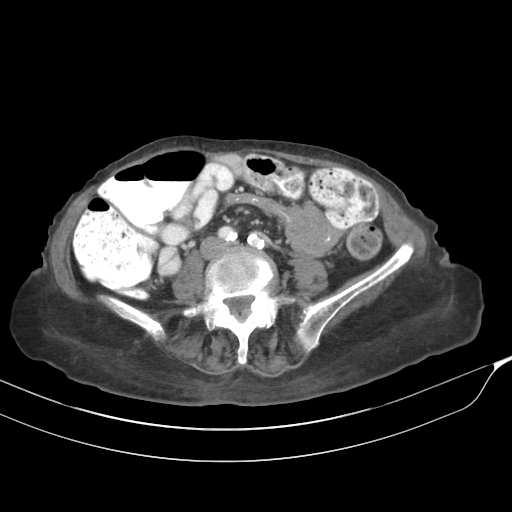
[im 49/92  soft-tissue]
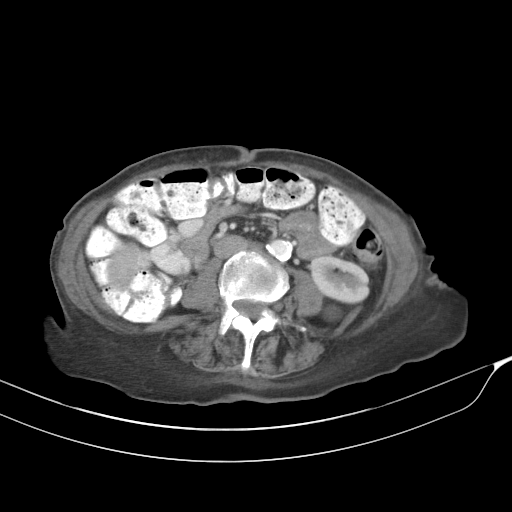
[im 55/92  soft-tissue]
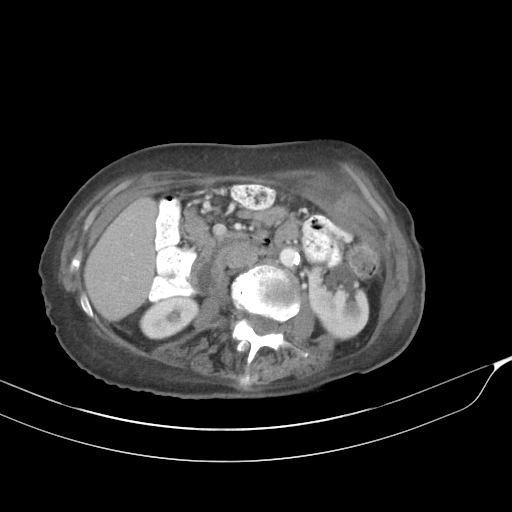
[im 61/92  soft-tissue]
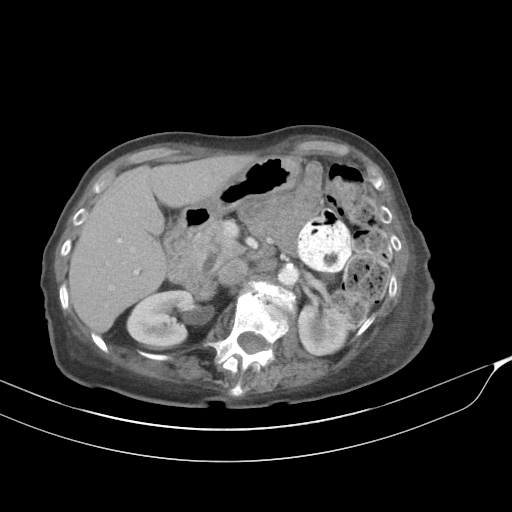
[im 61/92  bone]
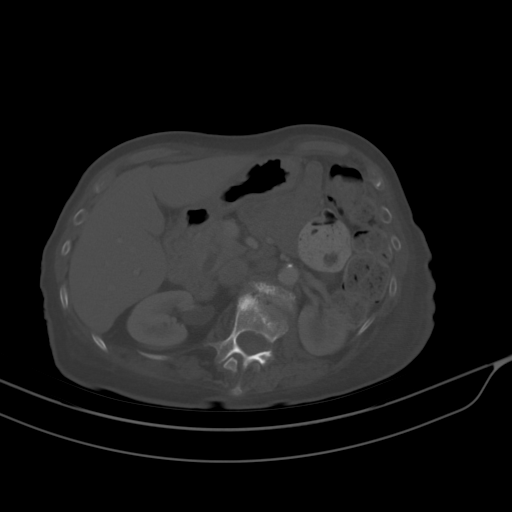
[im 67/92  lung]
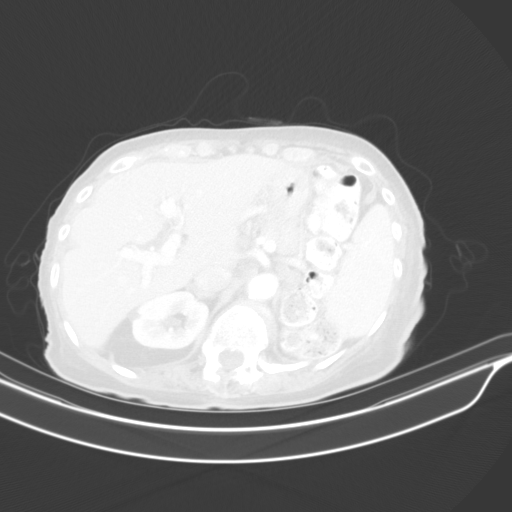
[im 73/92  soft-tissue]
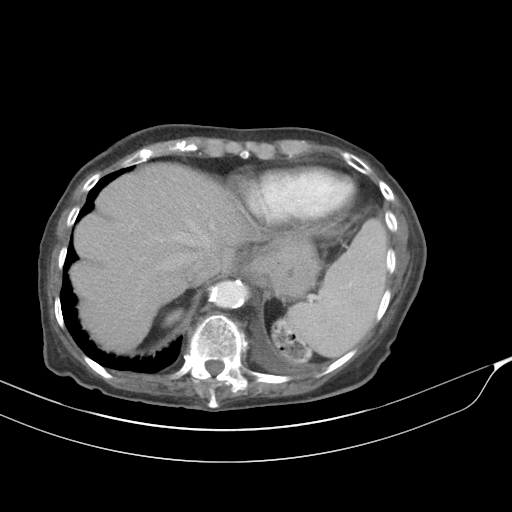
[im 73/92  lung]
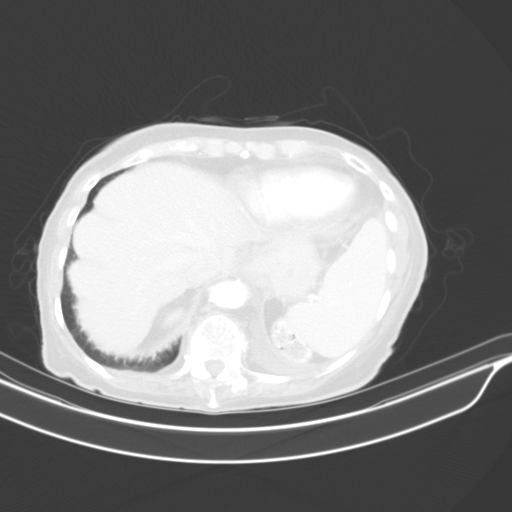
[im 79/92  soft-tissue]
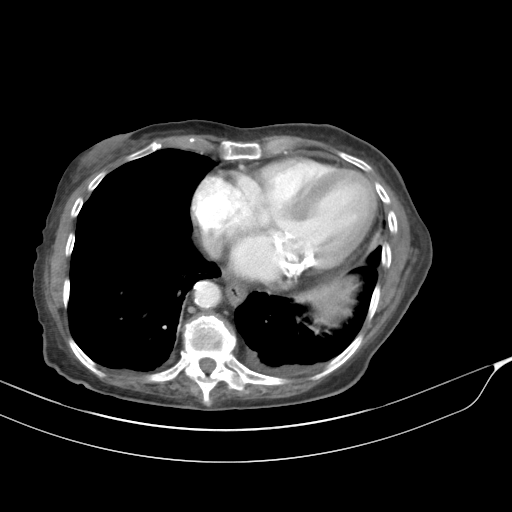
[im 79/92  lung]
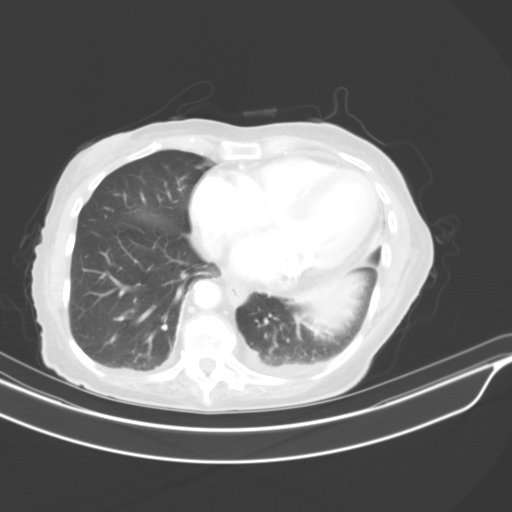
[im 85/92  soft-tissue]
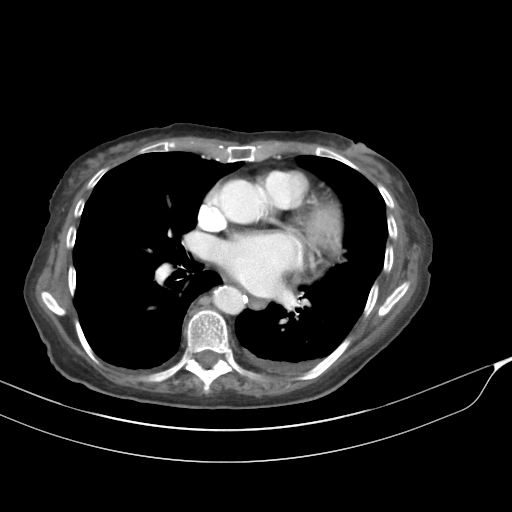
[im 85/92  lung]
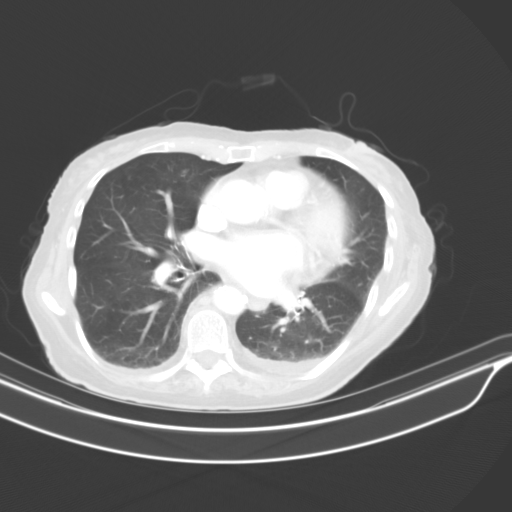

[13 of 32 positions shown; findings below may reference images not displayed]

FINDINGS: Lower chest: There is a small LEFT pleural effusion. There is
significant calcification of the mitral annulus. Coronary artery
calcifications are present. Heart is mildly enlarged.

Hepatobiliary: Status post cholecystectomy. There is mild intra and
extrahepatic biliary duct dilatation

Pancreas: Unremarkable. No pancreatic ductal dilatation or
surrounding inflammatory changes.

Spleen: Normal in size without focal abnormality.

Adrenals/Urinary Tract: Adrenal glands are normal in appearance.
Kidneys are symmetric in size. There is ptosis of the LEFT kidney.
Bilateral renal cysts are present, largest in the LOWER pole region
of the LEFT kidney, measuring 1.6 centimeters. Bilateral extrarenal
pelvis. The ureters are unremarkable. The bladder and visualized
portion of the urethra are normal.

Stomach/Bowel: Stomach is normal in appearance. Small bowel loops
are not dilated. Normal wall thickness. There is significant stool
throughout mildly distended loops of colon. The appendix is not well
seen. No inflammatory changes in the RIGHT LOWER QUADRANT. The

Vascular/Lymphatic: There is dense atherosclerotic calcification of
the abdominal aorta, not associated with aneurysm. Normal size and
opacification of the venous structures. No retroperitoneal or
mesenteric adenopathy.

Reproductive: Hysterectomy.  No adnexal mass.

Other: No free pelvic fluid. Note is made of body wall edema in the
pelvis.

Musculoskeletal: There is an acute fracture of the RIGHT transverse
process of L5. Comminuted acute to subacute fracture of the RIGHT
pubic symphysis. Focal sclerosis of the RIGHT ischium, consistent
with healing fracture. The

Marked convex LEFT scoliosis of the lumbar spine and associated
degenerative changes. There is a chronic compression fracture at T10
associated with degenerative changes. Degenerative changes are seen
in both hips, RIGHT greater than LEFT.
IMPRESSION: 1. No pelvic mass or evidence for venous obstruction to account for
the patient's LOWER extremity edema.
2. Acute fracture of the RIGHT transverse process of L5.
3. Comminuted acute to subacute fracture of the RIGHT pubic
symphysis.
4. Healing fracture of the RIGHT ischium.
5. Chronic compression fracture at T10. Significant scoliosis and
associated degenerative
6. Cardiomegaly and coronary artery disease.
7. Small LEFT pleural effusion.
8. Status post cholecystectomy.
9. Bilateral renal cysts.
10. Significant stool burden.
11. Hysterectomy.
12. Aortic Atherosclerosis ([id number].H).

These results will be called to the ordering clinician or
representative by the Radiologist Assistant, and communication
documented in the PACS or [REDACTED].

## 2022-05-08 ENCOUNTER — Telehealth (HOSPITAL_BASED_OUTPATIENT_CLINIC_OR_DEPARTMENT_OTHER): Payer: Self-pay

## 2022-05-08 DIAGNOSIS — I10 Essential (primary) hypertension: Secondary | ICD-10-CM

## 2022-05-08 NOTE — Telephone Encounter (Addendum)
Called results to patient and left results on VM (ok per DPR), instructions left to call office back if patient has any questions! Labs mailed to patient.     ----- Message from Loel Dubonnet, NP sent at 05/08/2022  1:59 PM EDT ----- Kidney function decreased from previous.  Likely related to dehydration.  Her lisinopril was stopped during recent office visit with Dr. Harrell Gave to help with this.  Recommend repeat BMP within the next 1 to 2 weeks to reassess her kidney function when off lisinopril.

## 2022-05-22 DIAGNOSIS — D509 Iron deficiency anemia, unspecified: Secondary | ICD-10-CM | POA: Diagnosis not present

## 2022-05-22 DIAGNOSIS — E782 Mixed hyperlipidemia: Secondary | ICD-10-CM | POA: Diagnosis not present

## 2022-05-22 DIAGNOSIS — Z602 Problems related to living alone: Secondary | ICD-10-CM | POA: Diagnosis not present

## 2022-05-22 DIAGNOSIS — Z90710 Acquired absence of both cervix and uterus: Secondary | ICD-10-CM | POA: Diagnosis not present

## 2022-05-22 DIAGNOSIS — I110 Hypertensive heart disease with heart failure: Secondary | ICD-10-CM | POA: Diagnosis not present

## 2022-05-22 DIAGNOSIS — F419 Anxiety disorder, unspecified: Secondary | ICD-10-CM | POA: Diagnosis not present

## 2022-05-22 DIAGNOSIS — Z853 Personal history of malignant neoplasm of breast: Secondary | ICD-10-CM | POA: Diagnosis not present

## 2022-05-22 DIAGNOSIS — Z9181 History of falling: Secondary | ICD-10-CM | POA: Diagnosis not present

## 2022-05-22 DIAGNOSIS — Z87891 Personal history of nicotine dependence: Secondary | ICD-10-CM | POA: Diagnosis not present

## 2022-05-22 DIAGNOSIS — E43 Unspecified severe protein-calorie malnutrition: Secondary | ICD-10-CM | POA: Diagnosis not present

## 2022-05-22 DIAGNOSIS — E039 Hypothyroidism, unspecified: Secondary | ICD-10-CM | POA: Diagnosis not present

## 2022-05-22 DIAGNOSIS — R296 Repeated falls: Secondary | ICD-10-CM | POA: Diagnosis not present

## 2022-05-22 DIAGNOSIS — I700 Atherosclerosis of aorta: Secondary | ICD-10-CM | POA: Diagnosis not present

## 2022-05-22 DIAGNOSIS — I872 Venous insufficiency (chronic) (peripheral): Secondary | ICD-10-CM | POA: Diagnosis not present

## 2022-05-22 DIAGNOSIS — I509 Heart failure, unspecified: Secondary | ICD-10-CM | POA: Diagnosis not present

## 2022-05-22 DIAGNOSIS — M810 Age-related osteoporosis without current pathological fracture: Secondary | ICD-10-CM | POA: Diagnosis not present

## 2022-10-07 DEATH — deceased
